# Patient Record
Sex: Female | Born: 1985 | Race: White | Hispanic: No | Marital: Married | Smoking: Never smoker
Health system: Southern US, Community
[De-identification: ages and names within clinical notes are randomized; demographics above are authoritative.]

## PROBLEM LIST (undated history)

## (undated) ENCOUNTER — Inpatient Hospital Stay (HOSPITAL_COMMUNITY): Payer: Self-pay

## (undated) DIAGNOSIS — K219 Gastro-esophageal reflux disease without esophagitis: Secondary | ICD-10-CM

## (undated) DIAGNOSIS — G90A Postural orthostatic tachycardia syndrome (POTS): Secondary | ICD-10-CM

## (undated) DIAGNOSIS — R112 Nausea with vomiting, unspecified: Secondary | ICD-10-CM

## (undated) DIAGNOSIS — T8859XA Other complications of anesthesia, initial encounter: Secondary | ICD-10-CM

## (undated) DIAGNOSIS — F419 Anxiety disorder, unspecified: Secondary | ICD-10-CM

## (undated) DIAGNOSIS — Z9889 Other specified postprocedural states: Secondary | ICD-10-CM

## (undated) DIAGNOSIS — F32A Depression, unspecified: Secondary | ICD-10-CM

## (undated) HISTORY — DX: Gastro-esophageal reflux disease without esophagitis: K21.9

## (undated) HISTORY — PX: CLAVICLE SURGERY: SHX598

## (undated) HISTORY — PX: HIP SURGERY: SHX245

---

## 2012-11-14 DIAGNOSIS — F32A Depression, unspecified: Secondary | ICD-10-CM | POA: Insufficient documentation

## 2017-07-13 DIAGNOSIS — Z8742 Personal history of other diseases of the female genital tract: Secondary | ICD-10-CM | POA: Insufficient documentation

## 2020-12-10 ENCOUNTER — Other Ambulatory Visit (HOSPITAL_COMMUNITY): Payer: Self-pay

## 2020-12-10 MED ORDER — DULOXETINE HCL 20 MG PO CPEP
40.0000 mg | ORAL_CAPSULE | Freq: Every morning | ORAL | 0 refills | Status: DC
  Filled 2020-12-10 – 2020-12-18 (×2): qty 60, 30d supply, fill #0

## 2020-12-10 MED ORDER — METOPROLOL TARTRATE 25 MG PO TABS
25.0000 mg | ORAL_TABLET | Freq: Three times a day (TID) | ORAL | 3 refills | Status: DC
  Filled 2020-12-10 – 2020-12-18 (×2): qty 90, 30d supply, fill #0
  Filled 2021-01-24: qty 90, 30d supply, fill #1

## 2020-12-10 MED ORDER — GABAPENTIN 100 MG PO CAPS
200.0000 mg | ORAL_CAPSULE | Freq: Every day | ORAL | 2 refills | Status: DC
  Filled 2020-12-10 – 2021-03-03 (×2): qty 60, 30d supply, fill #0
  Filled 2021-04-07: qty 60, 30d supply, fill #1
  Filled 2021-06-10: qty 60, 30d supply, fill #2

## 2020-12-11 ENCOUNTER — Other Ambulatory Visit (HOSPITAL_COMMUNITY): Payer: Self-pay

## 2020-12-11 MED ORDER — LORAZEPAM 1 MG PO TABS
1.0000 mg | ORAL_TABLET | Freq: Two times a day (BID) | ORAL | 3 refills | Status: DC
  Filled 2020-12-11 – 2020-12-18 (×2): qty 60, 30d supply, fill #0
  Filled 2021-01-28: qty 60, 30d supply, fill #1
  Filled 2021-03-03: qty 60, 30d supply, fill #2
  Filled 2021-04-07: qty 60, 30d supply, fill #3

## 2020-12-18 ENCOUNTER — Other Ambulatory Visit (HOSPITAL_COMMUNITY): Payer: Self-pay

## 2020-12-19 ENCOUNTER — Other Ambulatory Visit (HOSPITAL_COMMUNITY): Payer: Self-pay

## 2020-12-30 DIAGNOSIS — S42001D Fracture of unspecified part of right clavicle, subsequent encounter for fracture with routine healing: Secondary | ICD-10-CM | POA: Diagnosis not present

## 2020-12-30 DIAGNOSIS — S42011D Anterior displaced fracture of sternal end of right clavicle, subsequent encounter for fracture with routine healing: Secondary | ICD-10-CM | POA: Diagnosis not present

## 2020-12-30 DIAGNOSIS — S143XXD Injury of brachial plexus, subsequent encounter: Secondary | ICD-10-CM | POA: Diagnosis not present

## 2021-01-10 DIAGNOSIS — E038 Other specified hypothyroidism: Secondary | ICD-10-CM | POA: Diagnosis not present

## 2021-01-10 DIAGNOSIS — E538 Deficiency of other specified B group vitamins: Secondary | ICD-10-CM | POA: Diagnosis not present

## 2021-01-10 DIAGNOSIS — B2790 Infectious mononucleosis, unspecified without complication: Secondary | ICD-10-CM | POA: Diagnosis not present

## 2021-01-10 DIAGNOSIS — E559 Vitamin D deficiency, unspecified: Secondary | ICD-10-CM | POA: Diagnosis not present

## 2021-01-10 DIAGNOSIS — I498 Other specified cardiac arrhythmias: Secondary | ICD-10-CM | POA: Diagnosis not present

## 2021-01-10 DIAGNOSIS — R790 Abnormal level of blood mineral: Secondary | ICD-10-CM | POA: Diagnosis not present

## 2021-01-10 DIAGNOSIS — F419 Anxiety disorder, unspecified: Secondary | ICD-10-CM | POA: Diagnosis not present

## 2021-01-10 DIAGNOSIS — E60 Dietary zinc deficiency: Secondary | ICD-10-CM | POA: Diagnosis not present

## 2021-01-10 DIAGNOSIS — E612 Magnesium deficiency: Secondary | ICD-10-CM | POA: Diagnosis not present

## 2021-01-24 ENCOUNTER — Other Ambulatory Visit (HOSPITAL_COMMUNITY): Payer: Self-pay

## 2021-01-27 ENCOUNTER — Other Ambulatory Visit (HOSPITAL_COMMUNITY): Payer: Self-pay

## 2021-01-28 ENCOUNTER — Other Ambulatory Visit (HOSPITAL_COMMUNITY): Payer: Self-pay

## 2021-01-28 MED ORDER — DULOXETINE HCL 20 MG PO CPEP
40.0000 mg | ORAL_CAPSULE | Freq: Every morning | ORAL | 2 refills | Status: DC
  Filled 2021-01-28: qty 60, 30d supply, fill #0
  Filled 2021-03-03: qty 60, 30d supply, fill #1
  Filled 2021-04-07: qty 60, 30d supply, fill #2

## 2021-03-03 ENCOUNTER — Other Ambulatory Visit (HOSPITAL_COMMUNITY): Payer: Self-pay

## 2021-03-06 ENCOUNTER — Encounter (HOSPITAL_BASED_OUTPATIENT_CLINIC_OR_DEPARTMENT_OTHER): Payer: Self-pay | Admitting: *Deleted

## 2021-03-06 ENCOUNTER — Other Ambulatory Visit: Payer: Self-pay

## 2021-03-06 ENCOUNTER — Emergency Department (HOSPITAL_BASED_OUTPATIENT_CLINIC_OR_DEPARTMENT_OTHER)
Admission: EM | Admit: 2021-03-06 | Discharge: 2021-03-07 | Disposition: A | Discharge status: Home / Self Care | Payer: 59 | Attending: Emergency Medicine | Admitting: Emergency Medicine

## 2021-03-06 DIAGNOSIS — R197 Diarrhea, unspecified: Secondary | ICD-10-CM | POA: Insufficient documentation

## 2021-03-06 DIAGNOSIS — R000 Tachycardia, unspecified: Secondary | ICD-10-CM | POA: Diagnosis not present

## 2021-03-06 DIAGNOSIS — R112 Nausea with vomiting, unspecified: Secondary | ICD-10-CM | POA: Diagnosis not present

## 2021-03-06 DIAGNOSIS — R1084 Generalized abdominal pain: Secondary | ICD-10-CM | POA: Diagnosis not present

## 2021-03-06 DIAGNOSIS — Z79899 Other long term (current) drug therapy: Secondary | ICD-10-CM | POA: Diagnosis not present

## 2021-03-06 HISTORY — DX: Anxiety disorder, unspecified: F41.9

## 2021-03-06 HISTORY — DX: Depression, unspecified: F32.A

## 2021-03-06 HISTORY — DX: Postural orthostatic tachycardia syndrome (POTS): G90.A

## 2021-03-06 LAB — URINALYSIS, ROUTINE W REFLEX MICROSCOPIC: Specific Gravity, Urine: <1.005 — ABNORMAL LOW (ref 1.005–1.030)

## 2021-03-06 LAB — CBC
HCT: 38 % (ref 36.0–46.0)
Hemoglobin: 12.8 g/dL (ref 12.0–15.0)
MCH: 32.9 pg (ref 26.0–34.0)
MCHC: 33.7 g/dL (ref 30.0–36.0)
MCV: 97.7 fL (ref 80.0–100.0)
Platelets: 236 10*3/uL (ref 150–400)
RBC: 3.89 MIL/uL (ref 3.87–5.11)
RDW: 11.7 % (ref 11.5–15.5)
WBC: 9 10*3/uL (ref 4.0–10.5)
nRBC: 0 % (ref 0.0–0.2)

## 2021-03-06 LAB — COMPREHENSIVE METABOLIC PANEL
ALT: 10 U/L (ref 0–44)
AST: 14 U/L — ABNORMAL LOW (ref 15–41)
Albumin: 4 g/dL (ref 3.5–5.0)
Alkaline Phosphatase: 31 U/L — ABNORMAL LOW (ref 38–126)
Anion gap: 8 (ref 5–15)
BUN: 12 mg/dL (ref 6–20)
CO2: 25 mmol/L (ref 22–32)
Calcium: 8 mg/dL — ABNORMAL LOW (ref 8.9–10.3)
Chloride: 107 mmol/L (ref 98–111)
Creatinine, Ser: 0.62 mg/dL (ref 0.44–1.00)
GFR, Estimated: >60 mL/min (ref >60)
Glucose, Bld: 104 mg/dL — ABNORMAL HIGH (ref 70–99)
Potassium: 3.8 mmol/L (ref 3.5–5.1)
Sodium: 140 mmol/L (ref 135–145)
Total Bilirubin: 0.9 mg/dL (ref 0.3–1.2)
Total Protein: 6.5 g/dL (ref 6.5–8.1)

## 2021-03-06 LAB — LIPASE, BLOOD: Lipase: 14 U/L (ref 11–51)

## 2021-03-06 LAB — URINALYSIS, MICROSCOPIC (REFLEX): RBC / HPF: >50 RBC/hpf (ref 0–5)

## 2021-03-06 LAB — PREGNANCY, URINE: Preg Test, Ur: NEGATIVE

## 2021-03-06 MED ORDER — SODIUM CHLORIDE 0.9 % IV SOLN
25.0000 mg | Freq: Four times a day (QID) | INTRAVENOUS | Status: DC | PRN
  Administered 2021-03-06: 25 mg via INTRAVENOUS
  Filled 2021-03-06: qty 1

## 2021-03-06 MED ORDER — ONDANSETRON HCL 4 MG/2ML IJ SOLN
4.0000 mg | Freq: Once | INTRAMUSCULAR | Status: AC
  Administered 2021-03-06: 4 mg via INTRAVENOUS
  Filled 2021-03-06: qty 2

## 2021-03-06 MED ORDER — LACTATED RINGERS IV BOLUS
1000.0000 mL | Freq: Once | INTRAVENOUS | Status: AC
  Administered 2021-03-06: 1000 mL via INTRAVENOUS

## 2021-03-06 MED ORDER — PROMETHAZINE HCL 25 MG/ML IJ SOLN
INTRAMUSCULAR | Status: AC
  Filled 2021-03-06: qty 1

## 2021-03-06 MED ORDER — LACTATED RINGERS IV BOLUS
1000.0000 mL | Freq: Once | INTRAVENOUS | Status: AC
  Administered 2021-03-07: 1000 mL via INTRAVENOUS

## 2021-03-06 NOTE — ED Notes (Signed)
N/V/D since 3pm today.  Pt had 4 episodes of vomiting and about 6 episodes of diarrhea. No fever.  Pt does not feel it was food related.

## 2021-03-06 NOTE — ED Notes (Signed)
Pt had 1L NS and 4mg  zofran from ems at home but continued to be very nauseated

## 2021-03-06 NOTE — ED Notes (Signed)
EDP Charm Barges made aware of Persistent Nausea and Vomiting.
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
## 2021-03-07 ENCOUNTER — Other Ambulatory Visit (HOSPITAL_COMMUNITY): Payer: Self-pay

## 2021-03-07 DIAGNOSIS — R112 Nausea with vomiting, unspecified: Secondary | ICD-10-CM | POA: Diagnosis not present

## 2021-03-07 DIAGNOSIS — Z79899 Other long term (current) drug therapy: Secondary | ICD-10-CM | POA: Diagnosis not present

## 2021-03-07 DIAGNOSIS — R000 Tachycardia, unspecified: Secondary | ICD-10-CM | POA: Diagnosis not present

## 2021-03-07 DIAGNOSIS — R197 Diarrhea, unspecified: Secondary | ICD-10-CM | POA: Diagnosis not present

## 2021-03-07 LAB — C DIFFICILE QUICK SCREEN W PCR REFLEX
C Diff antigen: NEGATIVE
C Diff interpretation: NOT DETECTED
C Diff toxin: NEGATIVE

## 2021-03-07 LAB — GASTROINTESTINAL PANEL BY PCR, STOOL (REPLACES STOOL CULTURE)

## 2021-03-07 MED ORDER — ONDANSETRON 8 MG PO TBDP
8.0000 mg | ORAL_TABLET | Freq: Three times a day (TID) | ORAL | 0 refills | Status: DC | PRN
  Filled 2021-03-07: qty 4, 2d supply, fill #0

## 2021-03-07 MED ORDER — ONDANSETRON 8 MG PO TBDP: ORAL_TABLET | ORAL | 0 refills | Status: DC

## 2021-03-07 MED ORDER — LORAZEPAM 1 MG PO TABS
0.5000 mg | ORAL_TABLET | Freq: Once | ORAL | Status: AC
  Administered 2021-03-07: 0.5 mg via ORAL
  Filled 2021-03-07: qty 1

## 2021-03-07 NOTE — ED Notes (Addendum)
Lab called with Positive results for Noro Virus, Dr Rubin Payor aware. No new orders given. Attempted to reach patient, no answer.

## 2021-03-07 NOTE — ED Notes (Signed)
This RN presented the AVS utilizing Teachback Method. Patient verbalizes understanding of Discharge Instructions. Opportunity for Questioning and Answers were provided. Patient Discharged from ED ambulatory to Home.   

## 2021-03-17 ENCOUNTER — Other Ambulatory Visit (HOSPITAL_COMMUNITY): Payer: Self-pay

## 2021-04-03 DIAGNOSIS — J343 Hypertrophy of nasal turbinates: Secondary | ICD-10-CM | POA: Diagnosis not present

## 2021-04-03 DIAGNOSIS — J310 Chronic rhinitis: Secondary | ICD-10-CM | POA: Diagnosis not present

## 2021-04-03 DIAGNOSIS — J342 Deviated nasal septum: Secondary | ICD-10-CM | POA: Diagnosis not present

## 2021-04-04 ENCOUNTER — Other Ambulatory Visit: Payer: Self-pay | Admitting: Otolaryngology

## 2021-04-04 ENCOUNTER — Other Ambulatory Visit (HOSPITAL_COMMUNITY): Payer: Self-pay | Admitting: Otolaryngology

## 2021-04-04 DIAGNOSIS — J320 Chronic maxillary sinusitis: Secondary | ICD-10-CM

## 2021-04-07 ENCOUNTER — Other Ambulatory Visit (HOSPITAL_COMMUNITY): Payer: Self-pay

## 2021-04-14 ENCOUNTER — Other Ambulatory Visit: Payer: Self-pay

## 2021-04-14 ENCOUNTER — Ambulatory Visit (HOSPITAL_BASED_OUTPATIENT_CLINIC_OR_DEPARTMENT_OTHER)
Admission: RE | Admit: 2021-04-14 | Discharge: 2021-04-14 | Disposition: A | Discharge status: Home / Self Care | Payer: 59 | Source: Ambulatory Visit | Attending: Otolaryngology | Admitting: Otolaryngology

## 2021-04-14 DIAGNOSIS — J320 Chronic maxillary sinusitis: Secondary | ICD-10-CM | POA: Diagnosis not present

## 2021-04-15 DIAGNOSIS — R5382 Chronic fatigue, unspecified: Secondary | ICD-10-CM | POA: Diagnosis not present

## 2021-04-15 DIAGNOSIS — E349 Endocrine disorder, unspecified: Secondary | ICD-10-CM | POA: Diagnosis not present

## 2021-04-15 DIAGNOSIS — E569 Vitamin deficiency, unspecified: Secondary | ICD-10-CM | POA: Diagnosis not present

## 2021-04-15 DIAGNOSIS — G90A Postural orthostatic tachycardia syndrome (POTS): Secondary | ICD-10-CM | POA: Diagnosis not present

## 2021-04-15 DIAGNOSIS — Z8349 Family history of other endocrine, nutritional and metabolic diseases: Secondary | ICD-10-CM | POA: Diagnosis not present

## 2021-05-06 ENCOUNTER — Other Ambulatory Visit (HOSPITAL_COMMUNITY): Payer: Self-pay

## 2021-05-06 MED ORDER — DULOXETINE HCL 20 MG PO CPEP
40.0000 mg | ORAL_CAPSULE | Freq: Every morning | ORAL | 2 refills | Status: DC
  Filled 2021-05-06: qty 60, 30d supply, fill #0
  Filled 2021-06-10: qty 60, 30d supply, fill #1
  Filled 2021-07-04: qty 60, 30d supply, fill #2

## 2021-05-09 ENCOUNTER — Other Ambulatory Visit (HOSPITAL_COMMUNITY): Payer: Self-pay

## 2021-06-10 ENCOUNTER — Other Ambulatory Visit (HOSPITAL_COMMUNITY): Payer: Self-pay

## 2021-06-11 ENCOUNTER — Other Ambulatory Visit (HOSPITAL_COMMUNITY): Payer: Self-pay

## 2021-06-16 ENCOUNTER — Other Ambulatory Visit (HOSPITAL_COMMUNITY): Payer: Self-pay

## 2021-07-03 ENCOUNTER — Emergency Department (HOSPITAL_COMMUNITY)
Admission: EM | Admit: 2021-07-03 | Discharge: 2021-07-03 | Disposition: A | Discharge status: Home / Self Care | Payer: 59 | Attending: Emergency Medicine | Admitting: Emergency Medicine

## 2021-07-03 ENCOUNTER — Other Ambulatory Visit: Payer: Self-pay

## 2021-07-03 ENCOUNTER — Encounter (HOSPITAL_COMMUNITY): Payer: Self-pay | Admitting: Emergency Medicine

## 2021-07-03 ENCOUNTER — Other Ambulatory Visit (HOSPITAL_COMMUNITY): Payer: Self-pay

## 2021-07-03 DIAGNOSIS — N9489 Other specified conditions associated with female genital organs and menstrual cycle: Secondary | ICD-10-CM | POA: Insufficient documentation

## 2021-07-03 DIAGNOSIS — R197 Diarrhea, unspecified: Secondary | ICD-10-CM | POA: Diagnosis not present

## 2021-07-03 DIAGNOSIS — R112 Nausea with vomiting, unspecified: Secondary | ICD-10-CM | POA: Insufficient documentation

## 2021-07-03 DIAGNOSIS — R000 Tachycardia, unspecified: Secondary | ICD-10-CM | POA: Diagnosis not present

## 2021-07-03 DIAGNOSIS — J029 Acute pharyngitis, unspecified: Secondary | ICD-10-CM | POA: Diagnosis not present

## 2021-07-03 DIAGNOSIS — R0981 Nasal congestion: Secondary | ICD-10-CM | POA: Diagnosis not present

## 2021-07-03 DIAGNOSIS — H9209 Otalgia, unspecified ear: Secondary | ICD-10-CM | POA: Diagnosis not present

## 2021-07-03 DIAGNOSIS — R059 Cough, unspecified: Secondary | ICD-10-CM | POA: Diagnosis not present

## 2021-07-03 DIAGNOSIS — Z20822 Contact with and (suspected) exposure to covid-19: Secondary | ICD-10-CM | POA: Insufficient documentation

## 2021-07-03 LAB — COMPREHENSIVE METABOLIC PANEL
ALT: 30 U/L (ref 0–44)
AST: 22 U/L (ref 15–41)
Albumin: 4.5 g/dL (ref 3.5–5.0)
Alkaline Phosphatase: 44 U/L (ref 38–126)
Anion gap: 9 (ref 5–15)
BUN: 15 mg/dL (ref 6–20)
CO2: 23 mmol/L (ref 22–32)
Calcium: 8.8 mg/dL — ABNORMAL LOW (ref 8.9–10.3)
Chloride: 104 mmol/L (ref 98–111)
Creatinine, Ser: 0.61 mg/dL (ref 0.44–1.00)
GFR, Estimated: >60 mL/min (ref >60)
Glucose, Bld: 150 mg/dL — ABNORMAL HIGH (ref 70–99)
Potassium: 3.3 mmol/L — ABNORMAL LOW (ref 3.5–5.1)
Sodium: 136 mmol/L (ref 135–145)
Total Bilirubin: 1.1 mg/dL (ref 0.3–1.2)
Total Protein: 7.7 g/dL (ref 6.5–8.1)

## 2021-07-03 LAB — CBC WITH DIFFERENTIAL/PLATELET
Abs Immature Granulocytes: 0.03 10*3/uL (ref 0.00–0.07)
Basophils Absolute: 0 10*3/uL (ref 0.0–0.1)
Basophils Relative: 0 %
Eosinophils Absolute: 0 10*3/uL (ref 0.0–0.5)
Eosinophils Relative: 1 %
HCT: 41.9 % (ref 36.0–46.0)
Hemoglobin: 14.3 g/dL (ref 12.0–15.0)
Immature Granulocytes: 0 %
Lymphocytes Relative: 6 %
Lymphs Abs: 0.5 10*3/uL — ABNORMAL LOW (ref 0.7–4.0)
MCH: 32.1 pg (ref 26.0–34.0)
MCHC: 34.1 g/dL (ref 30.0–36.0)
MCV: 93.9 fL (ref 80.0–100.0)
Monocytes Absolute: 0.4 10*3/uL (ref 0.1–1.0)
Monocytes Relative: 4 %
Neutro Abs: 7.1 10*3/uL (ref 1.7–7.7)
Neutrophils Relative %: 89 %
Platelets: 197 10*3/uL (ref 150–400)
RBC: 4.46 MIL/uL (ref 3.87–5.11)
RDW: 12.9 % (ref 11.5–15.5)
WBC: 8 10*3/uL (ref 4.0–10.5)
nRBC: 0 % (ref 0.0–0.2)

## 2021-07-03 LAB — RESP PANEL BY RT-PCR (FLU A&B, COVID) ARPGX2
Influenza A by PCR: NEGATIVE
Influenza B by PCR: NEGATIVE
SARS Coronavirus 2 by RT PCR: NEGATIVE

## 2021-07-03 LAB — I-STAT BETA HCG BLOOD, ED (MC, WL, AP ONLY): I-stat hCG, quantitative: <5 m[IU]/mL (ref <5)

## 2021-07-03 MED ORDER — SODIUM CHLORIDE 0.9 % IV BOLUS
1000.0000 mL | Freq: Once | INTRAVENOUS | Status: AC
  Administered 2021-07-03: 1000 mL via INTRAVENOUS

## 2021-07-03 MED ORDER — DIPHENOXYLATE-ATROPINE 2.5-0.025 MG PO TABS
1.0000 | ORAL_TABLET | Freq: Four times a day (QID) | ORAL | 0 refills | Status: DC | PRN
  Filled 2021-07-03 (×2): qty 30, 4d supply, fill #0

## 2021-07-03 MED ORDER — ONDANSETRON HCL 4 MG/2ML IJ SOLN
4.0000 mg | Freq: Once | INTRAMUSCULAR | Status: AC
  Administered 2021-07-03: 4 mg via INTRAVENOUS
  Filled 2021-07-03: qty 2

## 2021-07-03 MED ORDER — DIPHENHYDRAMINE HCL 50 MG/ML IJ SOLN
50.0000 mg | Freq: Once | INTRAMUSCULAR | Status: DC
  Filled 2021-07-03: qty 1

## 2021-07-03 MED ORDER — PROMETHAZINE HCL 25 MG PO TABS
25.0000 mg | ORAL_TABLET | Freq: Four times a day (QID) | ORAL | 0 refills | Status: DC | PRN
  Filled 2021-07-03: qty 15, 4d supply, fill #0

## 2021-07-03 MED ORDER — ONDANSETRON HCL 4 MG PO TABS
4.0000 mg | ORAL_TABLET | Freq: Four times a day (QID) | ORAL | 0 refills | Status: DC
  Filled 2021-07-03: qty 12, 3d supply, fill #0

## 2021-07-03 MED ORDER — KETOROLAC TROMETHAMINE 30 MG/ML IJ SOLN
15.0000 mg | Freq: Once | INTRAMUSCULAR | Status: AC
  Administered 2021-07-03: 15 mg via INTRAVENOUS
  Filled 2021-07-03: qty 1

## 2021-07-03 MED ORDER — SODIUM CHLORIDE 0.9 % IV SOLN
25.0000 mg | Freq: Four times a day (QID) | INTRAVENOUS | Status: DC | PRN
  Administered 2021-07-03: 25 mg via INTRAVENOUS
  Filled 2021-07-03: qty 1

## 2021-07-03 MED ORDER — HALOPERIDOL LACTATE 5 MG/ML IJ SOLN
2.0000 mg | Freq: Once | INTRAMUSCULAR | Status: AC
  Administered 2021-07-03: 2 mg via INTRAVENOUS
  Filled 2021-07-03: qty 1

## 2021-07-03 MED ORDER — DIPHENHYDRAMINE HCL 50 MG/ML IJ SOLN
25.0000 mg | Freq: Once | INTRAMUSCULAR | Status: AC
  Administered 2021-07-03: 25 mg via INTRAVENOUS

## 2021-07-03 MED ORDER — PROMETHAZINE HCL 25 MG/ML IJ SOLN
INTRAMUSCULAR | Status: AC
  Filled 2021-07-03: qty 1

## 2021-07-03 NOTE — ED Triage Notes (Signed)
Pt c/o n/v/d that started this am.  ?

## 2021-07-03 NOTE — ED Provider Notes (Signed)
?[facility] EMERGENCY DEPARTMENT ?Provider Note ? ? ?CSN: [id number] ?Arrival date & time: 07/03/21  8588 ? ?  ? ?History ? ?Chief Complaint  ?Patient presents with  ? Emesis  ? ? ?Lauren Holmes is a 36 y.o. female. ? ?Patient presents to the emergency department for evaluation of nausea, vomiting and diarrhea.  Patient reports that symptoms just began several hours ago.  She has been unable to hold anything down.  She does report that she has been sick for about a week with nasal congestion, cough, ear pain, sore throat. ? ? ?  ? ?Home Medications ?Prior to Admission medications   ?Medication Sig Start Date End Date Taking? Authorizing Provider  ?DULoxetine (CYMBALTA) 20 MG capsule Take 2 capsules (40 mg total) by mouth in the morning. 05/06/21     ?gabapentin (NEURONTIN) 100 MG capsule Take 2 capsules (200 mg total) by mouth daily. 11/18/20     ?LORazepam (ATIVAN) 1 MG tablet Take 1 tablet (1 mg total) by mouth 2 (two) times daily. 12/11/20     ?metoprolol tartrate (LOPRESSOR) 25 MG tablet Take 1 tablet (25 mg total) by mouth 3 (three) times daily. 10/17/20     ?ondansetron (ZOFRAN ODT) 8 MG disintegrating tablet Take 1 tablet (8 mg total) by mouth every 8 (eight) hours as needed for nausea 03/07/21   Palumbo, April, MD  ?   ? ?Allergies    ?Oxycodone and Hydroxyzine   ? ?Review of Systems   ?Review of Systems  ?HENT:  Positive for congestion, ear pain and sore throat.   ?Respiratory:  Positive for cough.   ?Gastrointestinal:  Positive for diarrhea, nausea and vomiting.  ? ?Physical Exam ?Updated Vital Signs ?BP (!) 109/50   Pulse (!) 121   Temp 97.8 ?F (36.6 ?C) (Oral)   Resp 18   Ht 5' (1.524 m)   Wt 56.7 kg   SpO2 100%   BMI 24.41 kg/m?  ?Physical Exam ?Vitals and nursing note reviewed.  ?Constitutional:   ?   General: She is not in acute distress. ?   Appearance: She is well-developed.  ?HENT:  ?   Head: Normocephalic and atraumatic.  ?   Right Ear: Tympanic membrane normal.  ?   Left Ear: Tympanic membrane  normal.  ?   Mouth/Throat:  ?   Mouth: Mucous membranes are dry.  ?Eyes:  ?   General: Vision grossly intact. Gaze aligned appropriately.  ?   Extraocular Movements: Extraocular movements intact.  ?   Conjunctiva/sclera: Conjunctivae normal.  ?Cardiovascular:  ?   Rate and Rhythm: Regular rhythm. Tachycardia present.  ?   Pulses: Normal pulses.  ?   Heart sounds: Normal heart sounds, S1 normal and S2 normal. No murmur heard. ?  No friction rub. No gallop.  ?Pulmonary:  ?   Effort: Pulmonary effort is normal. No respiratory distress.  ?   Breath sounds: Normal breath sounds.  ?Abdominal:  ?   General: Bowel sounds are normal.  ?   Palpations: Abdomen is soft.  ?   Tenderness: There is no abdominal tenderness. There is no guarding or rebound.  ?   Hernia: No hernia is present.  ?Musculoskeletal:     ?   General: No swelling.  ?   Cervical back: Full passive range of motion without pain, normal range of motion and neck supple. No spinous process tenderness or muscular tenderness. Normal range of motion.  ?   Right lower leg: No edema.  ?   Left  lower leg: No edema.  ?Skin: ?   General: Skin is warm and dry.  ?   Capillary Refill: Capillary refill takes less than 2 seconds.  ?   Findings: No ecchymosis, erythema, rash or wound.  ?Neurological:  ?   General: No focal deficit present.  ?   Mental Status: She is alert and oriented to person, place, and time.  ?   GCS: GCS eye subscore is 4. GCS verbal subscore is 5. GCS motor subscore is 6.  ?   Cranial Nerves: Cranial nerves 2-12 are intact.  ?   Sensory: Sensation is intact.  ?   Motor: Motor function is intact.  ?   Coordination: Coordination is intact.  ?Psychiatric:     ?   Attention and Perception: Attention normal.     ?   Mood and Affect: Mood normal.     ?   Speech: Speech normal.     ?   Behavior: Behavior normal.  ? ? ?ED Results / Procedures / Treatments   ?Labs ?(all labs ordered are listed, but only abnormal results are displayed) ?Labs Reviewed  ?CBC WITH  DIFFERENTIAL/PLATELET - Abnormal; Notable for the following components:  ?    Result Value  ? Lymphs Abs 0.5 (*)   ? All other components within normal limits  ?COMPREHENSIVE METABOLIC PANEL - Abnormal; Notable for the following components:  ? Potassium 3.3 (*)   ? Glucose, Bld 150 (*)   ? Calcium 8.8 (*)   ? All other components within normal limits  ?RESP PANEL BY RT-PCR (FLU A&B, COVID) ARPGX2  ?URINALYSIS, ROUTINE W REFLEX MICROSCOPIC  ?I-STAT BETA HCG BLOOD, ED ([facility], [facility], [facility] ONLY)  ? ? ?EKG ?None ? ?Radiology ?No results found. ? ?Procedures ?Procedures  ? ? ?Medications Ordered in ED ?Medications  ?promethazine (PHENERGAN) 25 mg in sodium chloride 0.9 % 50 mL IVPB (0 mg Intravenous Stopped 07/03/21 0616)  ?sodium chloride 0.9 % bolus 1,000 mL (has no administration in time range)  ?haloperidol lactate (HALDOL) injection 2 mg (has no administration in time range)  ?diphenhydrAMINE (BENADRYL) injection 50 mg (has no administration in time range)  ?ketorolac (TORADOL) 30 MG/ML injection 15 mg (has no administration in time range)  ?sodium chloride 0.9 % bolus 1,000 mL (0 mLs Intravenous Stopped 07/03/21 0615)  ? ? ?ED Course/ Medical Decision Making/ A&P ?  ?                        ?Medical Decision Making ?Amount and/or Complexity of Data Reviewed ?Labs: ordered. ? ?Risk ?Prescription drug management. ? ? ?Presents to the emergency department for cute onset of nausea, vomiting and diarrhea overnight.  She has had URI symptoms for approximately a week. ? ?Differential diagnosis: COVID, influenza, nonspecific viral illness. ? ?Patient has a benign abdominal exam.  No focal tenderness to suggest acute surgical process.  She is not pregnant.  Patient has a constellation of symptoms that suggest viral etiology.  She does appear mildly dehydrated.  Treated with IV fluids and antiemetics.  Lab work is reassuring. ? ? ? ? ? ? ? ?Final Clinical Impression(s) / ED Diagnoses ?Final diagnoses:  ?Nausea vomiting and diarrhea   ? ? ?Rx / DC Orders ?ED Discharge Orders   ? ? None  ? ?  ? ? ?  ?Gilda Crease, MD ?07/03/21 6237 ? ?

## 2021-07-03 NOTE — ED Provider Notes (Signed)
Change of shift care was accepted from off going provider, patient has reassuring lab work-up, she has been given 2 L of IV fluid and multiple ENT nausea medicines, she states that she is not as nauseated, she has still has the feeling of cramping, she states that she wants to go home and does not want to try any other medicines here.  I think this is reasonable, she seems to have her medical decision capacity, she has a nonsurgical abdomen, she understands indications for return.  Prescriptions for nausea have been given ?  ?Noemi Chapel, MD ?07/03/21 0740 ? ?

## 2021-07-03 NOTE — Discharge Instructions (Addendum)
Both promethazine and Zofran have been prescribed, please drink plenty of clear liquids and return to the emergency department as needed for severe or worsening symptoms.  I would encourage you to stay out of work for the next few days as this may be contagious to others ?

## 2021-07-04 ENCOUNTER — Other Ambulatory Visit (HOSPITAL_COMMUNITY): Payer: Self-pay

## 2021-07-08 DIAGNOSIS — U099 Post covid-19 condition, unspecified: Secondary | ICD-10-CM | POA: Diagnosis not present

## 2021-07-08 DIAGNOSIS — F419 Anxiety disorder, unspecified: Secondary | ICD-10-CM | POA: Diagnosis not present

## 2021-07-08 DIAGNOSIS — G479 Sleep disorder, unspecified: Secondary | ICD-10-CM | POA: Diagnosis not present

## 2021-07-08 DIAGNOSIS — G90A Postural orthostatic tachycardia syndrome (POTS): Secondary | ICD-10-CM | POA: Diagnosis not present

## 2021-07-08 DIAGNOSIS — E569 Vitamin deficiency, unspecified: Secondary | ICD-10-CM | POA: Diagnosis not present

## 2021-07-08 DIAGNOSIS — R5382 Chronic fatigue, unspecified: Secondary | ICD-10-CM | POA: Diagnosis not present

## 2021-07-09 ENCOUNTER — Other Ambulatory Visit (HOSPITAL_COMMUNITY): Payer: Self-pay

## 2021-07-18 ENCOUNTER — Other Ambulatory Visit (HOSPITAL_COMMUNITY): Payer: Self-pay

## 2021-07-18 MED ORDER — PROGESTERONE MICRONIZED 100 MG PO CAPS
100.0000 mg | ORAL_CAPSULE | Freq: Every evening | ORAL | 4 refills | Status: DC
  Filled 2021-07-18: qty 90, 90d supply, fill #0

## 2021-07-18 MED ORDER — DULOXETINE HCL 20 MG PO CPEP
40.0000 mg | ORAL_CAPSULE | Freq: Every evening | ORAL | 5 refills | Status: DC
  Filled 2021-07-18 – 2021-08-06 (×2): qty 180, 90d supply, fill #0
  Filled 2021-10-26 – 2021-10-27 (×3): qty 180, 90d supply, fill #1
  Filled 2022-01-28: qty 180, 90d supply, fill #2

## 2021-08-05 DIAGNOSIS — J321 Chronic frontal sinusitis: Secondary | ICD-10-CM | POA: Diagnosis not present

## 2021-08-05 DIAGNOSIS — J342 Deviated nasal septum: Secondary | ICD-10-CM | POA: Diagnosis not present

## 2021-08-05 DIAGNOSIS — J322 Chronic ethmoidal sinusitis: Secondary | ICD-10-CM | POA: Diagnosis not present

## 2021-08-06 ENCOUNTER — Other Ambulatory Visit (HOSPITAL_COMMUNITY): Payer: Self-pay

## 2021-08-13 ENCOUNTER — Other Ambulatory Visit: Payer: Self-pay | Admitting: Otolaryngology

## 2021-09-12 ENCOUNTER — Other Ambulatory Visit (HOSPITAL_COMMUNITY): Payer: Self-pay

## 2021-09-12 MED ORDER — CLONIDINE HCL 0.2 MG PO TABS
ORAL_TABLET | ORAL | 4 refills | Status: DC
  Filled 2021-09-12: qty 45, 30d supply, fill #0

## 2021-09-22 ENCOUNTER — Other Ambulatory Visit (HOSPITAL_COMMUNITY): Payer: Self-pay

## 2021-10-02 ENCOUNTER — Encounter (HOSPITAL_BASED_OUTPATIENT_CLINIC_OR_DEPARTMENT_OTHER): Payer: Self-pay | Admitting: Otolaryngology

## 2021-10-02 ENCOUNTER — Other Ambulatory Visit: Payer: Self-pay

## 2021-10-12 NOTE — Anesthesia Preprocedure Evaluation (Addendum)
Anesthesia Evaluation  Patient identified by MRN, date of birth, ID band Patient awake    Reviewed: Allergy & Precautions, NPO status , Patient's Chart, lab work & pertinent test results, reviewed documented beta blocker date and time   History of Anesthesia Complications (+) PONV and history of anesthetic complications  Airway Mallampati: II  TM Distance: >3 FB Neck ROM: Full    Dental no notable dental hx.    Pulmonary neg pulmonary ROS,    Pulmonary exam normal        Cardiovascular Pt. on home beta blockers Normal cardiovascular exam  POTS   Neuro/Psych Anxiety Depression    GI/Hepatic negative GI ROS, Neg liver ROS,   Endo/Other  negative endocrine ROS  Renal/GU negative Renal ROS  negative genitourinary   Musculoskeletal negative musculoskeletal ROS (+)   Abdominal   Peds  Hematology negative hematology ROS (+)   Anesthesia Other Findings Day of surgery medications reviewed with patient.  Reproductive/Obstetrics negative OB ROS                            Anesthesia Physical Anesthesia Plan  ASA: 2  Anesthesia Plan: General   Post-op Pain Management: Tylenol PO (pre-op)* and Toradol IV (intra-op)*   Induction:   PONV Risk Score and Plan: 4 or greater and Midazolam, Scopolamine patch - Pre-op, Treatment may vary due to age or medical condition, Dexamethasone, Ondansetron, Propofol infusion and TIVA  Airway Management Planned: Oral ETT  Additional Equipment: None  Intra-op Plan:   Post-operative Plan: Extubation in OR  Informed Consent: I have reviewed the patients History and Physical, chart, labs and discussed the procedure including the risks, benefits and alternatives for the proposed anesthesia with the patient or authorized representative who has indicated his/her understanding and acceptance.     Dental advisory given  Plan Discussed with: CRNA  Anesthesia  Plan Comments:        Anesthesia Quick Evaluation

## 2021-10-13 ENCOUNTER — Ambulatory Visit (HOSPITAL_BASED_OUTPATIENT_CLINIC_OR_DEPARTMENT_OTHER): Payer: 59 | Admitting: Anesthesiology

## 2021-10-13 ENCOUNTER — Encounter (HOSPITAL_BASED_OUTPATIENT_CLINIC_OR_DEPARTMENT_OTHER): Payer: Self-pay | Admitting: Otolaryngology

## 2021-10-13 ENCOUNTER — Other Ambulatory Visit (HOSPITAL_COMMUNITY): Payer: Self-pay

## 2021-10-13 ENCOUNTER — Encounter (HOSPITAL_BASED_OUTPATIENT_CLINIC_OR_DEPARTMENT_OTHER)
Admission: RE | Disposition: A | Discharge status: Home / Self Care | Payer: Self-pay | Source: Ambulatory Visit | Attending: Otolaryngology

## 2021-10-13 ENCOUNTER — Other Ambulatory Visit: Payer: Self-pay

## 2021-10-13 ENCOUNTER — Ambulatory Visit (HOSPITAL_BASED_OUTPATIENT_CLINIC_OR_DEPARTMENT_OTHER)
Admission: RE | Admit: 2021-10-13 | Discharge: 2021-10-13 | Disposition: A | Discharge status: Home / Self Care | Payer: 59 | Source: Ambulatory Visit | Attending: Otolaryngology | Admitting: Otolaryngology

## 2021-10-13 DIAGNOSIS — J321 Chronic frontal sinusitis: Secondary | ICD-10-CM | POA: Diagnosis not present

## 2021-10-13 DIAGNOSIS — J342 Deviated nasal septum: Secondary | ICD-10-CM | POA: Insufficient documentation

## 2021-10-13 DIAGNOSIS — J338 Other polyp of sinus: Secondary | ICD-10-CM | POA: Diagnosis not present

## 2021-10-13 DIAGNOSIS — J328 Other chronic sinusitis: Secondary | ICD-10-CM | POA: Diagnosis not present

## 2021-10-13 DIAGNOSIS — J322 Chronic ethmoidal sinusitis: Secondary | ICD-10-CM

## 2021-10-13 DIAGNOSIS — J341 Cyst and mucocele of nose and nasal sinus: Secondary | ICD-10-CM | POA: Diagnosis not present

## 2021-10-13 DIAGNOSIS — Z01818 Encounter for other preprocedural examination: Secondary | ICD-10-CM

## 2021-10-13 DIAGNOSIS — J3489 Other specified disorders of nose and nasal sinuses: Secondary | ICD-10-CM

## 2021-10-13 DIAGNOSIS — J329 Chronic sinusitis, unspecified: Secondary | ICD-10-CM | POA: Diagnosis not present

## 2021-10-13 HISTORY — PX: SINUS ENDO WITH FUSION: SHX5329

## 2021-10-13 HISTORY — DX: Other specified postprocedural states: Z98.890

## 2021-10-13 HISTORY — PX: FRONTAL SINUS EXPLORATION: SHX6591

## 2021-10-13 HISTORY — PX: SEPTOPLASTY: SHX2393

## 2021-10-13 HISTORY — DX: Other specified postprocedural states: R11.2

## 2021-10-13 HISTORY — PX: ETHMOIDECTOMY: SHX5197

## 2021-10-13 HISTORY — DX: Other complications of anesthesia, initial encounter: T88.59XA

## 2021-10-13 LAB — POCT PREGNANCY, URINE: Preg Test, Ur: NEGATIVE

## 2021-10-13 SURGERY — SURGERY, PARANASAL SINUS, ENDOSCOPIC, WITH NASAL SEPTOPLASTY, TURBINOPLASTY, AND MAXILLARY SINUSOTOMY
Anesthesia: General | Site: Nose | Laterality: Left

## 2021-10-13 MED ORDER — ROCURONIUM BROMIDE 10 MG/ML (PF) SYRINGE
PREFILLED_SYRINGE | INTRAVENOUS | Status: AC
  Filled 2021-10-13: qty 10

## 2021-10-13 MED ORDER — FENTANYL CITRATE (PF) 100 MCG/2ML IJ SOLN
INTRAMUSCULAR | Status: DC | PRN
Start: 2021-10-13 — End: 2021-10-13
  Administered 2021-10-13 (×2): 50 ug via INTRAVENOUS
  Administered 2021-10-13: 100 ug via INTRAVENOUS

## 2021-10-13 MED ORDER — ACETAMINOPHEN 500 MG PO TABS
ORAL_TABLET | ORAL | Status: AC
  Filled 2021-10-13: qty 2

## 2021-10-13 MED ORDER — LIDOCAINE 2% (20 MG/ML) 5 ML SYRINGE
INTRAMUSCULAR | Status: AC
  Filled 2021-10-13: qty 5

## 2021-10-13 MED ORDER — FENTANYL CITRATE (PF) 100 MCG/2ML IJ SOLN
INTRAMUSCULAR | Status: AC
  Filled 2021-10-13: qty 2

## 2021-10-13 MED ORDER — OXYMETAZOLINE HCL 0.05 % NA SOLN
NASAL | Status: DC | PRN
  Administered 2021-10-13: 1 via TOPICAL

## 2021-10-13 MED ORDER — ONDANSETRON HCL 4 MG/2ML IJ SOLN
INTRAMUSCULAR | Status: AC
  Filled 2021-10-13: qty 2

## 2021-10-13 MED ORDER — CEFAZOLIN SODIUM-DEXTROSE 2-3 GM-%(50ML) IV SOLR
INTRAVENOUS | Status: DC | PRN
  Administered 2021-10-13: 2 g via INTRAVENOUS

## 2021-10-13 MED ORDER — TRAMADOL HCL 50 MG PO TABS
50.0000 mg | ORAL_TABLET | Freq: Once | ORAL | Status: AC
  Administered 2021-10-13: 50 mg via ORAL

## 2021-10-13 MED ORDER — LIDOCAINE HCL (CARDIAC) PF 100 MG/5ML IV SOSY
PREFILLED_SYRINGE | INTRAVENOUS | Status: DC | PRN
  Administered 2021-10-13: 60 mg via INTRAVENOUS

## 2021-10-13 MED ORDER — PROPOFOL 500 MG/50ML IV EMUL
INTRAVENOUS | Status: AC
  Filled 2021-10-13: qty 150

## 2021-10-13 MED ORDER — KETOROLAC TROMETHAMINE 30 MG/ML IJ SOLN
30.0000 mg | Freq: Once | INTRAMUSCULAR | Status: AC
  Administered 2021-10-13: 30 mg via INTRAVENOUS

## 2021-10-13 MED ORDER — DEXAMETHASONE SODIUM PHOSPHATE 10 MG/ML IJ SOLN
INTRAMUSCULAR | Status: AC
  Filled 2021-10-13: qty 1

## 2021-10-13 MED ORDER — CEFAZOLIN SODIUM 1 G IJ SOLR
INTRAMUSCULAR | Status: AC
  Filled 2021-10-13: qty 20

## 2021-10-13 MED ORDER — MIDAZOLAM HCL 5 MG/5ML IJ SOLN
INTRAMUSCULAR | Status: DC | PRN
  Administered 2021-10-13: 2 mg via INTRAVENOUS

## 2021-10-13 MED ORDER — TRAMADOL HCL 50 MG PO TABS
ORAL_TABLET | ORAL | Status: AC
  Filled 2021-10-13: qty 1

## 2021-10-13 MED ORDER — AMISULPRIDE (ANTIEMETIC) 5 MG/2ML IV SOLN: 10.0000 mg | Freq: Once | INTRAVENOUS | Status: DC | PRN

## 2021-10-13 MED ORDER — SUGAMMADEX SODIUM 200 MG/2ML IV SOLN
INTRAVENOUS | Status: DC | PRN
  Administered 2021-10-13: 115 mg via INTRAVENOUS

## 2021-10-13 MED ORDER — ROCURONIUM BROMIDE 100 MG/10ML IV SOLN
INTRAVENOUS | Status: DC | PRN
  Administered 2021-10-13: 40 mg via INTRAVENOUS

## 2021-10-13 MED ORDER — KETOROLAC TROMETHAMINE 30 MG/ML IJ SOLN
INTRAMUSCULAR | Status: AC
  Filled 2021-10-13: qty 1

## 2021-10-13 MED ORDER — PROPOFOL 10 MG/ML IV BOLUS
INTRAVENOUS | Status: DC | PRN
  Administered 2021-10-13: 120 mg via INTRAVENOUS

## 2021-10-13 MED ORDER — PROPOFOL 500 MG/50ML IV EMUL
INTRAVENOUS | Status: DC | PRN
  Administered 2021-10-13: 175 ug/kg/min via INTRAVENOUS
  Administered 2021-10-13: 200 ug/kg/min via INTRAVENOUS
  Administered 2021-10-13: 150 ug/kg/min via INTRAVENOUS

## 2021-10-13 MED ORDER — ONDANSETRON HCL 4 MG/2ML IJ SOLN
INTRAMUSCULAR | Status: DC | PRN
  Administered 2021-10-13: 4 mg via INTRAVENOUS

## 2021-10-13 MED ORDER — PROPOFOL 10 MG/ML IV BOLUS
INTRAVENOUS | Status: AC
  Filled 2021-10-13: qty 20

## 2021-10-13 MED ORDER — MIDAZOLAM HCL 2 MG/2ML IJ SOLN
INTRAMUSCULAR | Status: AC
  Filled 2021-10-13: qty 2

## 2021-10-13 MED ORDER — ACETAMINOPHEN 500 MG PO TABS
1000.0000 mg | ORAL_TABLET | Freq: Once | ORAL | Status: AC
Start: 2021-10-13 — End: 2021-10-13
  Administered 2021-10-13: 1000 mg via ORAL

## 2021-10-13 MED ORDER — HYDROCODONE-ACETAMINOPHEN 5-325 MG PO TABS
1.0000 | ORAL_TABLET | ORAL | 0 refills | Status: AC | PRN
  Filled 2021-10-13: qty 12, 2d supply, fill #0

## 2021-10-13 MED ORDER — SCOPOLAMINE 1 MG/3DAYS TD PT72
1.0000 | MEDICATED_PATCH | Freq: Once | TRANSDERMAL | Status: DC
  Administered 2021-10-13: 1.5 mg via TRANSDERMAL

## 2021-10-13 MED ORDER — AMOXICILLIN 875 MG PO TABS
875.0000 mg | ORAL_TABLET | Freq: Two times a day (BID) | ORAL | 0 refills | Status: AC
  Filled 2021-10-13: qty 6, 3d supply, fill #0

## 2021-10-13 MED ORDER — PROPOFOL 500 MG/50ML IV EMUL
INTRAVENOUS | Status: AC
  Filled 2021-10-13: qty 200

## 2021-10-13 MED ORDER — FENTANYL CITRATE (PF) 100 MCG/2ML IJ SOLN
25.0000 ug | INTRAMUSCULAR | Status: DC | PRN
  Administered 2021-10-13: 50 ug via INTRAVENOUS

## 2021-10-13 MED ORDER — SCOPOLAMINE 1 MG/3DAYS TD PT72
MEDICATED_PATCH | TRANSDERMAL | Status: AC
  Filled 2021-10-13: qty 1

## 2021-10-13 MED ORDER — LIDOCAINE-EPINEPHRINE 1 %-1:100000 IJ SOLN
INTRAMUSCULAR | Status: DC | PRN
  Administered 2021-10-13: 3 mL

## 2021-10-13 MED ORDER — DEXAMETHASONE SODIUM PHOSPHATE 4 MG/ML IJ SOLN
INTRAMUSCULAR | Status: DC | PRN
  Administered 2021-10-13: 8 mg via INTRAVENOUS

## 2021-10-13 MED ORDER — MUPIROCIN 2 % EX OINT
TOPICAL_OINTMENT | CUTANEOUS | Status: DC | PRN
  Administered 2021-10-13: 1 via TOPICAL

## 2021-10-13 MED ORDER — LACTATED RINGERS IV SOLN: INTRAVENOUS | Status: DC

## 2021-10-13 SURGICAL SUPPLY — 55 items
ATTRACTOMAT 16X20 MAGNETIC DRP (DRAPES) IMPLANT
BLADE RAD40 ROTATE 4M 4 5PK (BLADE) IMPLANT
BLADE RAD60 ROTATE M4 4 5PK (BLADE) IMPLANT
BLADE ROTATE RAD 12 4 M4 (BLADE) IMPLANT
BLADE ROTATE RAD 40 4 M4 (BLADE) ×1 IMPLANT
BLADE ROTATE TRICUT 4X13 M4 (BLADE) ×3 IMPLANT
BLADE SURG 15 STRL LF DISP TIS (BLADE) IMPLANT
BLADE SURG 15 STRL SS (BLADE)
BLADE TRICUT ROTATE M4 4 5PK (BLADE) IMPLANT
BUR HS RAD FRONTAL 3 (BURR) IMPLANT
CANISTER SUC SOCK COL 7IN (MISCELLANEOUS) ×3 IMPLANT
CANISTER SUCT 1200ML W/VALVE (MISCELLANEOUS) ×6 IMPLANT
COAGULATOR SUCT 8FR VV (MISCELLANEOUS) ×3 IMPLANT
DEFOGGER MIRROR 1QT (MISCELLANEOUS) ×3 IMPLANT
DRSG NASAL KENNEDY LMNT 8CM (GAUZE/BANDAGES/DRESSINGS) IMPLANT
DRSG NASOPORE 8CM (GAUZE/BANDAGES/DRESSINGS) IMPLANT
DRSG TELFA 3X8 NADH (GAUZE/BANDAGES/DRESSINGS) IMPLANT
ELECT REM PT RETURN 9FT ADLT (ELECTROSURGICAL) ×3
ELECTRODE REM PT RTRN 9FT ADLT (ELECTROSURGICAL) ×2 IMPLANT
GLOVE BIO SURGEON STRL SZ7.5 (GLOVE) ×3 IMPLANT
GLOVE BIOGEL PI IND STRL 7.0 (GLOVE) IMPLANT
GLOVE BIOGEL PI INDICATOR 7.0 (GLOVE) ×1
GOWN STRL REUS W/ TWL LRG LVL3 (GOWN DISPOSABLE) ×4 IMPLANT
GOWN STRL REUS W/TWL LRG LVL3 (GOWN DISPOSABLE) ×6
HEMOSTAT SURGICEL 2X14 (HEMOSTASIS) IMPLANT
IV NS 500ML (IV SOLUTION) ×3
IV NS 500ML BAXH (IV SOLUTION) ×2 IMPLANT
NDL HYPO 25X1 1.5 SAFETY (NEEDLE) ×2 IMPLANT
NDL SPNL 25GX3.5 QUINCKE BL (NEEDLE) IMPLANT
NEEDLE HYPO 25X1 1.5 SAFETY (NEEDLE) ×3 IMPLANT
NEEDLE SPNL 25GX3.5 QUINCKE BL (NEEDLE) IMPLANT
NS IRRIG 1000ML POUR BTL (IV SOLUTION) ×3 IMPLANT
PACK BASIN DAY SURGERY FS (CUSTOM PROCEDURE TRAY) ×3 IMPLANT
PACK ENT DAY SURGERY (CUSTOM PROCEDURE TRAY) ×3 IMPLANT
PAD DRESSING TELFA 3X8 NADH (GAUZE/BANDAGES/DRESSINGS) IMPLANT
SLEEVE SCD COMPRESS KNEE MED (STOCKING) ×3 IMPLANT
SPIKE FLUID TRANSFER (MISCELLANEOUS) IMPLANT
SPLINT NASAL AIRWAY SILICONE (MISCELLANEOUS) ×3 IMPLANT
SPONGE GAUZE 2X2 8PLY STRL LF (GAUZE/BANDAGES/DRESSINGS) ×3 IMPLANT
SPONGE NEURO XRAY DETECT 1X3 (DISPOSABLE) ×3 IMPLANT
SUCTION FRAZIER HANDLE 10FR (MISCELLANEOUS)
SUCTION TUBE FRAZIER 10FR DISP (MISCELLANEOUS) IMPLANT
SUT CHROMIC 4 0 P 3 18 (SUTURE) ×3 IMPLANT
SUT PLAIN 4 0 ~~LOC~~ 1 (SUTURE) ×3 IMPLANT
SUT PROLENE 3 0 PS 2 (SUTURE) ×3 IMPLANT
SUT VIC AB 4-0 P-3 18XBRD (SUTURE) IMPLANT
SUT VIC AB 4-0 P3 18 (SUTURE)
SYR 50ML LL SCALE MARK (SYRINGE) ×1 IMPLANT
TOWEL GREEN STERILE FF (TOWEL DISPOSABLE) ×3 IMPLANT
TRACKER ENT INSTRUMENT (MISCELLANEOUS) ×3 IMPLANT
TRACKER ENT PATIENT (MISCELLANEOUS) ×3 IMPLANT
TUBE CONNECTING 20X1/4 (TUBING) ×3 IMPLANT
TUBE SALEM SUMP 16 FR W/ARV (TUBING) ×1 IMPLANT
TUBING STRAIGHTSHOT EPS 5PK (TUBING) ×3 IMPLANT
YANKAUER SUCT BULB TIP NO VENT (SUCTIONS) ×3 IMPLANT

## 2021-10-13 NOTE — Anesthesia Postprocedure Evaluation (Signed)
Anesthesia Post Note  Patient: Physiological scientist  Procedure(s) Performed: SINUS ENDOSCOPY WITH STEALTH NAVIGATION (Left: Nose) SEPTOPLASTY (Bilateral: Nose) TOTAL ETHMOIDECTOMY (Left: Nose) FRONTAL RECESS EXPLORATION (Left: Nose)     Patient location during evaluation: PACU Anesthesia Type: General Level of consciousness: awake and alert Pain management: pain level controlled Vital Signs Assessment: post-procedure vital signs reviewed and stable Respiratory status: spontaneous breathing, nonlabored ventilation and respiratory function stable Cardiovascular status: blood pressure returned to baseline Postop Assessment: no apparent nausea or vomiting Anesthetic complications: no   No notable events documented.  Last Vitals:  Vitals:   10/13/21 1115 10/13/21 1145  BP: 129/79 120/80  Pulse: 75 85  Resp: (!) 8 18  Temp:  36.4 C  SpO2: 97% 97%    Last Pain:  Vitals:   10/13/21 1145  TempSrc: Oral  PainSc: 0-No pain                 Marthenia Rolling

## 2021-10-13 NOTE — Discharge Instructions (Addendum)
Post Anesthesia Home Care Instructions  Activity: Get plenty of rest for the remainder of the day. A responsible individual must stay with you for 24 hours following the procedure.  For the next 24 hours, DO NOT: -Drive a car -Advertising copywriter -Drink alcoholic beverages -Take any medication unless instructed by your physician -Make any legal decisions or sign important papers.  Meals: Start with liquid foods such as gelatin or soup. Progress to regular foods as tolerated. Avoid greasy, spicy, heavy foods. If nausea and/or vomiting occur, drink only clear liquids until the nausea and/or vomiting subsides. Call your physician if vomiting continues.  Special Instructions/Symptoms: Your throat may feel dry or sore from the anesthesia or the breathing tube placed in your throat during surgery. If this causes discomfort, gargle with warm salt water. The discomfort should disappear within 24 hours.  If you had a scopolamine patch placed behind your ear for the management of post- operative nausea and/or vomiting:  1. The medication in the patch is effective for 72 hours, after which it should be removed.  Wrap patch in a tissue and discard in the trash. Wash hands thoroughly with soap and water. 2. You may remove the patch earlier than 72 hours if you experience unpleasant side effects which may include dry mouth, dizziness or visual disturbances. 3. Avoid touching the patch. Wash your hands with soap and water after contact with the patch.     No tylenol until after 1:45pm today, if needed. No ibuprofen until after 7:15pm today, if needed.  --------------  POSTOPERATIVE INSTRUCTIONS FOR PATIENTS HAVING NASAL OR SINUS OPERATIONS ACTIVITY: Restrict activity at home for the first two days, resting as much as possible. Light activity is best. You may usually return to work within a week. You should refrain from nose blowing, strenuous activity, or heavy lifting greater than 20lbs for a total of  one week after your operation.  If sneezing cannot be avoided, sneeze with your mouth open. DISCOMFORT: You may experience a dull headache and pressure along with nasal congestion and discharge. These symptoms may be worse during the first week after the operation but may last as long as two to four weeks.  Please take Tylenol or the pain medication that has been prescribed for you. Do not take aspirin or aspirin containing medications since they may cause bleeding.  You may experience symptoms of post nasal drainage, nasal congestion, headaches and fatigue for two or three months after your operation.  BLEEDING: You may have some blood tinged nasal drainage for approximately two weeks after the operation.  The discharge will be worse for the first week.  Please call our office at [phone] or go to the nearest hospital emergency room if you experience any of the following: heavy, bright red blood from your nose or mouth that lasts longer than 15 minutes or coughing up or vomiting bright red blood or blood clots. GENERAL CONSIDERATIONS: A gauze dressing will be placed on your upper lip to absorb any drainage after the operation. You may need to change this several times a day.  If you do not have very much drainage, you may remove the dressing.  Remember that you may gently wipe your nose with a tissue and sniff in, but DO NOT blow your nose. Please keep all of your postoperative appointments.  Your final results after the operation will depend on proper follow-up.  The initial visit is usually 2 to 5 days after the operation.  During this visit, the remaining  nasal packing and internal septal splints will be removed.  Your nasal and sinus cavities will be cleaned.  During the second visit, your nasal and sinus cavities will be cleaned again. Have someone drive you to your first two postoperative appointments.  How you care for your nose after the operation will influence the results that you obtain.  You  should follow all directions, take your medication as prescribed, and call our office [phone] with any problems or questions. You may be more comfortable sleeping with your head elevated on two pillows. Do not take any medications that we have not prescribed or recommended. WARNING SIGNS: if any of the following should occur, please call our office: Persistent fever greater than 102F. Persistent vomiting. Severe and constant pain that is not relieved by prescribed pain medication. Trauma to the nose. Rash or unusual side effects from any medicines.

## 2021-10-13 NOTE — H&P (Signed)
Cc: Septal deviation, chronic rhinosinusitis and ethmoid mucocele  HPI: The patient is a 36 year old female who returns today for her follow-up evaluation.  She was last seen in December 2022.  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  The patient subsequently underwent a sinus CT scan.  The CT showed a left ethmoid mucocele and complete opacification of the left frontal sinus, left frontoethmoidal recess, and anterior left ethmoid air cell. The patient returns today reporting nasal congestion and occasional facial pressure.  She denies any fever or visual change.  Exam: General: Communicates without difficulty, well nourished, no acute distress. Head: Normocephalic, no evidence injury, no tenderness, facial buttresses intact without stepoff. Eyes: PERRL, EOMI. No scleral icterus, conjunctivae clear. Neuro: CN II exam reveals vision grossly intact.  No nystagmus at any point of gaze. Ears: Auricles well formed without lesions.  Ear canals are intact without mass or lesion.  No erythema or edema is appreciated.  The TMs are intact without fluid. Nose: External evaluation reveals normal support and skin without lesions.  Dorsum is intact.  Anterior rhinoscopy reveals congested and edematous mucosa over anterior aspect of the inferior turbinates and nasal septum.  No purulence is noted. Middle meatus is not well visualized. Oral:  Oral cavity and oropharynx are intact, symmetric, without erythema or edema.  Mucosa is moist without lesions. Neck: Full range of motion without pain.  There is no significant lymphadenopathy.  No masses palpable.  Thyroid bed within normal limits to palpation.  Parotid glands and submandibular glands equal bilaterally without mass.  Trachea is midline. Neuro:  CN 2-12 grossly intact. Gait normal. Vestibular: No nystagmus at any point of gaze. A flexible scope was inserted into the right nasal cavity.  Endoscopy of the  interior nasal cavity, superior, inferior, and middle meatus was performed. The sphenoid-ethmoid recess was examined. Edematous mucosa was noted.  No polyp, mass, or lesion was appreciated. Nasal septal deviation noted.  Olfactory cleft was clear.  Nasopharynx was clear.  Turbinates were hypertrophied but without mass. The procedure was repeated on the contralateral side with similar findings.  The patient tolerated the procedure well.  Assessment: 1.  Chronic left frontal and ethmoid sinusitis, with complete opacification of the left frontal sinus, left frontoethmoidal recess, and left ethmoidal air cells.  The patient also has a large left ethmoid mucocele.  The CT showed marked thinning versus dehiscence of the overlying left lamina papyracea. 2.  Nasal septal deviation, causing obstruction of the left nasal passageway.  Plan: 1.  The nasal endoscopy findings and the CT results are reviewed with the patient. 2.  Based on the above findings, the patient will benefit from undergoing left endoscopic sinus surgery to address her left frontal and ethmoid sinuses.  She will also benefit from undergoing septoplasty to treat her leftward nasal septal deviation. 3.  The risk, benefits, alternatives, and details of the procedures are extensively discussed.  Questions are invited and answered. 4.  The patient would like to proceed with the procedures.

## 2021-10-13 NOTE — Transfer of Care (Signed)
Immediate Anesthesia Transfer of Care Note  Patient: Physiological scientist  Procedure(s) Performed: SINUS ENDOSCOPY WITH STEALTH NAVIGATION (Left: Nose) SEPTOPLASTY (Bilateral: Nose) TOTAL ETHMOIDECTOMY (Left: Nose) FRONTAL RECESS EXPLORATION (Left: Nose)  Patient Location: PACU  Anesthesia Type:MAC and General  Level of Consciousness: drowsy  Airway & Oxygen Therapy: Patient Spontanous Breathing and Patient connected to face mask oxygen  Post-op Assessment: Report given to RN and Post -op Vital signs reviewed and stable  Post vital signs: Reviewed and stable  Last Vitals:  Vitals Value Taken Time  BP 119/72 10/13/21 1100  Temp 37.1 C 10/13/21 1035  Pulse 86 10/13/21 1102  Resp 17 10/13/21 1102  SpO2 97 % 10/13/21 1102  Vitals shown include unvalidated device data.  Last Pain:  Vitals:   10/13/21 0738  TempSrc: Oral  PainSc: 0-No pain      Patients Stated Pain Goal: 6 (68/25/74 [id number])  Complications: No notable events documented.

## 2021-10-13 NOTE — Anesthesia Procedure Notes (Signed)
Procedure Name: Intubation Date/Time: 10/13/2021 8:53 AM  Performed by: Ezequiel Kayser, CRNAPre-anesthesia Checklist: Patient identified, Emergency Drugs available, Suction available and Patient being monitored Patient Re-evaluated:Patient Re-evaluated prior to induction Oxygen Delivery Method: Circle System Utilized Preoxygenation: Pre-oxygenation with 100% oxygen Induction Type: IV induction Ventilation: Mask ventilation without difficulty Laryngoscope Size: Mac and 3 Grade View: Grade I Tube type: Oral Tube size: 7.0 mm Number of attempts: 1 Airway Equipment and Method: Stylet and Oral airway Placement Confirmation: ETT inserted through vocal cords under direct vision, positive ETCO2 and breath sounds checked- equal and bilateral Secured at: 22 cm Tube secured with: Tape Dental Injury: Teeth and Oropharynx as per pre-operative assessment

## 2021-10-13 NOTE — Op Note (Signed)
DATE OF PROCEDURE: 10/13/2021  OPERATIVE REPORT   SURGEON: Newman Pies, MD   PREOPERATIVE DIAGNOSES:  1. Nasal septal deviation.  2. Chronic left frontal and ethmoid sinusitis 3. Chronic nasal obstruction. 4.  Left ethmoid mucocele  POSTOPERATIVE DIAGNOSES:  1. Nasal septal deviation.  2. Chronic left frontal and ethmoid sinusitis 3. Chronic nasal obstruction. 4.  Left ethmoid mucocele  PROCEDURE PERFORMED:  1. Septoplasty.  2. Left frontal sinusotomy and left total ethmoidectomy with polyp removal 3. FUSION stereotactic image guidance  ANESTHESIA: General endotracheal tube anesthesia.   COMPLICATIONS: None.   ESTIMATED BLOOD LOSS: 150 mL.   INDICATION FOR PROCEDURE: Jalaiya Oyster is a 36 y.o. female with a history of chronic nasal congestion, septal deviation, and ethmoid mucocele.  The patient subsequently underwent a sinus CT scan.  The CT showed a left ethmoid mucocele and complete opacification of the left frontal sinus, left frontoethmoidal recess, and anterior left ethmoid air cell. The patient continued to have nasal congestion and facial pressure despite medical treatment. Based on the above findings, the decision was made for the patient to undergo the above-stated procedures. The risks, benefits, alternatives, and details of the procedures were discussed with the patient. Questions were invited and answered. Informed consent was obtained.   DESCRIPTION OF PROCEDURE: The patient was taken to the operating room and placed supine on the operating table. General endotracheal tube anesthesia was administered by the anesthesiologist. The patient was positioned, and prepped and draped in the standard fashion for nasal surgery. Pledgets soaked with Afrin were placed in both nasal cavities for decongestion. The pledgets were subsequently removed. The FUSION stereotactic image guidance marker was placed. The image guidance system was functional throughout the case.  Examination of the  nasal cavity revealed a severe nasal septal deviation. 1% lidocaine with 1:100,000 epinephrine was injected onto the nasal septum bilaterally. A hemitransfixion incision was made on the left side. The mucosal flap was carefully elevated on the left side. A cartilaginous incision was made 1 cm superior to the caudal margin of the nasal septum. Mucosal flap was also elevated on the right side in the similar fashion. It should be noted that due to the severe septal deviation, the deviated portion of the cartilaginous and bony septum had to be removed in piecemeal fashion. Once the deviated portions were removed, a straight midline septum was achieved. The septum was then quilted with 4-0 plain gut sutures. The hemitransfixion incision was closed with interrupted 4-0 chromic sutures.   Using a 0 endoscope, the left nasal cavity was examined. A large middle turbinate was noted. Using Tru-Cut forceps, the inferior one third of the middle turbinate was resected. Polypoid tissue was noted within the middle meatus. The polypoid tissue was removed using a combination of microdebrider and Blakesley forceps. Attention was then focused on the ethmoid sinuses. The bony partitions of the anterior and posterior ethmoid cavities were taken down.  A large mucocele was noted.  The mucocele was marsupialized and mucoid fluid was suctioned and drained.  Attention was then focused on the frontal sinus. The frontal recess was identified and enlarged by removing the surrounding bony partitions. Polypoid tissue was removed from the frontal recess.  The sinuses were irrigated with saline solution. Doyle splints were applied to the nasal septum.  The care of the patient was turned over to the anesthesiologist. The patient was awakened from anesthesia without difficulty. The patient was extubated and transferred to the recovery room in good condition.   OPERATIVE FINDINGS: Nasal  septal deviation.  Chronic left ethmoid and frontal  sinusitis with left ethmoid mucocele.  SPECIMEN: Left sinus contents.   FOLLOWUP CARE: The patient be discharged home once she is awake and alert. The patient will be placed on vicodin p.r.n. pain, and amoxicillin 875 mg p.o. b.i.d. for 3 days. The patient will follow up in my office in 3 days for splint removal.   Shanti Agresti Philomena Doheny, MD

## 2021-10-14 ENCOUNTER — Encounter (HOSPITAL_BASED_OUTPATIENT_CLINIC_OR_DEPARTMENT_OTHER): Payer: Self-pay | Admitting: Otolaryngology

## 2021-10-14 LAB — SURGICAL PATHOLOGY

## 2021-10-16 ENCOUNTER — Other Ambulatory Visit (HOSPITAL_COMMUNITY): Payer: Self-pay

## 2021-10-16 DIAGNOSIS — J338 Other polyp of sinus: Secondary | ICD-10-CM | POA: Diagnosis not present

## 2021-10-16 DIAGNOSIS — J322 Chronic ethmoidal sinusitis: Secondary | ICD-10-CM | POA: Diagnosis not present

## 2021-10-16 DIAGNOSIS — J321 Chronic frontal sinusitis: Secondary | ICD-10-CM | POA: Diagnosis not present

## 2021-10-16 MED ORDER — OXYCODONE-ACETAMINOPHEN 5-325 MG PO TABS
1.0000 | ORAL_TABLET | Freq: Four times a day (QID) | ORAL | 0 refills | Status: DC | PRN
  Filled 2021-10-16: qty 12, 3d supply, fill #0

## 2021-10-16 MED ORDER — HYDROCODONE-ACETAMINOPHEN 5-325 MG PO TABS
1.0000 | ORAL_TABLET | Freq: Four times a day (QID) | ORAL | 0 refills | Status: DC | PRN
  Filled 2021-10-16: qty 12, 3d supply, fill #0

## 2021-10-27 ENCOUNTER — Other Ambulatory Visit (HOSPITAL_COMMUNITY): Payer: Self-pay

## 2021-10-28 ENCOUNTER — Other Ambulatory Visit (HOSPITAL_COMMUNITY): Payer: Self-pay

## 2022-01-21 ENCOUNTER — Other Ambulatory Visit (HOSPITAL_COMMUNITY): Payer: Self-pay

## 2022-01-21 MED ORDER — METOPROLOL TARTRATE 25 MG PO TABS
25.0000 mg | ORAL_TABLET | ORAL | 3 refills | Status: AC | PRN
  Filled 2022-01-21: qty 30, 30d supply, fill #0

## 2022-01-28 ENCOUNTER — Other Ambulatory Visit (HOSPITAL_COMMUNITY): Payer: Self-pay

## 2022-01-29 DIAGNOSIS — E569 Vitamin deficiency, unspecified: Secondary | ICD-10-CM | POA: Diagnosis not present

## 2022-01-29 DIAGNOSIS — R002 Palpitations: Secondary | ICD-10-CM | POA: Diagnosis not present

## 2022-01-29 DIAGNOSIS — F419 Anxiety disorder, unspecified: Secondary | ICD-10-CM | POA: Diagnosis not present

## 2022-01-29 DIAGNOSIS — U099 Post covid-19 condition, unspecified: Secondary | ICD-10-CM | POA: Diagnosis not present

## 2022-01-29 DIAGNOSIS — G479 Sleep disorder, unspecified: Secondary | ICD-10-CM | POA: Diagnosis not present

## 2022-01-29 DIAGNOSIS — F32A Depression, unspecified: Secondary | ICD-10-CM | POA: Diagnosis not present

## 2022-01-29 DIAGNOSIS — G90A Postural orthostatic tachycardia syndrome (POTS): Secondary | ICD-10-CM | POA: Diagnosis not present

## 2022-01-29 DIAGNOSIS — K5989 Other specified functional intestinal disorders: Secondary | ICD-10-CM | POA: Diagnosis not present

## 2022-02-05 ENCOUNTER — Other Ambulatory Visit (HOSPITAL_COMMUNITY): Payer: Self-pay

## 2022-02-06 ENCOUNTER — Other Ambulatory Visit (HOSPITAL_COMMUNITY): Payer: Self-pay

## 2022-02-10 ENCOUNTER — Other Ambulatory Visit (HOSPITAL_COMMUNITY): Payer: Self-pay

## 2022-02-10 MED ORDER — DULOXETINE HCL 20 MG PO CPEP
40.0000 mg | ORAL_CAPSULE | Freq: Every day | ORAL | 5 refills | Status: DC
  Filled 2022-02-10: qty 180, 90d supply, fill #0
  Filled 2022-04-06 – 2022-05-07 (×2): qty 180, 90d supply, fill #1
  Filled 2022-07-09: qty 180, 90d supply, fill #2

## 2022-02-10 MED ORDER — VALACYCLOVIR HCL 1 G PO TABS
ORAL_TABLET | ORAL | 1 refills | Status: AC
  Filled 2022-02-10: qty 60, 30d supply, fill #0

## 2022-02-11 ENCOUNTER — Other Ambulatory Visit (HOSPITAL_COMMUNITY): Payer: Self-pay

## 2022-02-11 MED ORDER — IVERMECTIN 3 MG PO TABS
9.0000 mg | ORAL_TABLET | Freq: Every day | ORAL | 0 refills | Status: DC
  Filled 2022-02-11: qty 15, 5d supply, fill #0

## 2022-02-13 ENCOUNTER — Other Ambulatory Visit (HOSPITAL_COMMUNITY): Payer: Self-pay

## 2022-02-13 MED ORDER — VALACYCLOVIR HCL 1 G PO TABS: 1000.0000 mg | ORAL_TABLET | Freq: Every day | ORAL | 1 refills | Status: DC

## 2022-02-18 DIAGNOSIS — Z01419 Encounter for gynecological examination (general) (routine) without abnormal findings: Secondary | ICD-10-CM | POA: Diagnosis not present

## 2022-02-18 DIAGNOSIS — N895 Stricture and atresia of vagina: Secondary | ICD-10-CM | POA: Diagnosis not present

## 2022-02-18 DIAGNOSIS — Z719 Counseling, unspecified: Secondary | ICD-10-CM | POA: Diagnosis not present

## 2022-02-18 DIAGNOSIS — Z2821 Immunization not carried out because of patient refusal: Secondary | ICD-10-CM | POA: Diagnosis not present

## 2022-02-22 DIAGNOSIS — A09 Infectious gastroenteritis and colitis, unspecified: Secondary | ICD-10-CM | POA: Diagnosis not present

## 2022-02-22 DIAGNOSIS — R112 Nausea with vomiting, unspecified: Secondary | ICD-10-CM | POA: Diagnosis not present

## 2022-02-22 DIAGNOSIS — R197 Diarrhea, unspecified: Secondary | ICD-10-CM | POA: Diagnosis not present

## 2022-02-22 DIAGNOSIS — Z885 Allergy status to narcotic agent status: Secondary | ICD-10-CM | POA: Diagnosis not present

## 2022-02-22 DIAGNOSIS — R000 Tachycardia, unspecified: Secondary | ICD-10-CM | POA: Diagnosis not present

## 2022-02-23 ENCOUNTER — Other Ambulatory Visit (HOSPITAL_COMMUNITY): Payer: Self-pay

## 2022-02-23 MED ORDER — CYCLOBENZAPRINE HCL 5 MG PO TABS
5.0000 mg | ORAL_TABLET | Freq: Three times a day (TID) | ORAL | 6 refills | Status: AC | PRN
Start: 2022-02-21 — End: ?
  Filled 2022-02-23 – 2022-04-06 (×2): qty 30, 10d supply, fill #0

## 2022-03-04 ENCOUNTER — Other Ambulatory Visit (HOSPITAL_COMMUNITY): Payer: Self-pay

## 2022-04-06 ENCOUNTER — Other Ambulatory Visit (HOSPITAL_COMMUNITY): Payer: Self-pay

## 2022-06-08 DIAGNOSIS — U099 Post covid-19 condition, unspecified: Secondary | ICD-10-CM | POA: Diagnosis not present

## 2022-06-08 DIAGNOSIS — E569 Vitamin deficiency, unspecified: Secondary | ICD-10-CM | POA: Diagnosis not present

## 2022-06-08 DIAGNOSIS — F3281 Premenstrual dysphoric disorder: Secondary | ICD-10-CM | POA: Diagnosis not present

## 2022-06-08 DIAGNOSIS — R5382 Chronic fatigue, unspecified: Secondary | ICD-10-CM | POA: Diagnosis not present

## 2022-06-08 DIAGNOSIS — E618 Deficiency of other specified nutrient elements: Secondary | ICD-10-CM | POA: Diagnosis not present

## 2022-06-08 DIAGNOSIS — G479 Sleep disorder, unspecified: Secondary | ICD-10-CM | POA: Diagnosis not present

## 2022-06-08 DIAGNOSIS — F419 Anxiety disorder, unspecified: Secondary | ICD-10-CM | POA: Diagnosis not present

## 2022-06-08 DIAGNOSIS — K5989 Other specified functional intestinal disorders: Secondary | ICD-10-CM | POA: Diagnosis not present

## 2022-06-08 DIAGNOSIS — F410 Panic disorder [episodic paroxysmal anxiety] without agoraphobia: Secondary | ICD-10-CM | POA: Diagnosis not present

## 2022-06-08 DIAGNOSIS — B349 Viral infection, unspecified: Secondary | ICD-10-CM | POA: Diagnosis not present

## 2022-06-24 DIAGNOSIS — K5989 Other specified functional intestinal disorders: Secondary | ICD-10-CM | POA: Diagnosis not present

## 2022-06-24 DIAGNOSIS — G90A Postural orthostatic tachycardia syndrome (POTS): Secondary | ICD-10-CM | POA: Diagnosis not present

## 2022-06-24 DIAGNOSIS — G479 Sleep disorder, unspecified: Secondary | ICD-10-CM | POA: Diagnosis not present

## 2022-06-24 DIAGNOSIS — E569 Vitamin deficiency, unspecified: Secondary | ICD-10-CM | POA: Diagnosis not present

## 2022-06-24 DIAGNOSIS — F419 Anxiety disorder, unspecified: Secondary | ICD-10-CM | POA: Diagnosis not present

## 2022-06-24 DIAGNOSIS — E618 Deficiency of other specified nutrient elements: Secondary | ICD-10-CM | POA: Diagnosis not present

## 2022-06-24 DIAGNOSIS — F3281 Premenstrual dysphoric disorder: Secondary | ICD-10-CM | POA: Diagnosis not present

## 2022-06-24 DIAGNOSIS — R5382 Chronic fatigue, unspecified: Secondary | ICD-10-CM | POA: Diagnosis not present

## 2022-06-24 DIAGNOSIS — F410 Panic disorder [episodic paroxysmal anxiety] without agoraphobia: Secondary | ICD-10-CM | POA: Diagnosis not present

## 2022-06-24 DIAGNOSIS — U099 Post covid-19 condition, unspecified: Secondary | ICD-10-CM | POA: Diagnosis not present

## 2022-07-09 ENCOUNTER — Other Ambulatory Visit (HOSPITAL_COMMUNITY): Payer: Self-pay

## 2022-08-15 ENCOUNTER — Other Ambulatory Visit (HOSPITAL_COMMUNITY): Payer: Self-pay

## 2022-08-16 MED ORDER — DULOXETINE HCL 20 MG PO CPEP
40.0000 mg | ORAL_CAPSULE | Freq: Every day | ORAL | 5 refills | Status: DC
  Filled 2022-08-16: qty 180, 90d supply, fill #0
  Filled 2022-08-19: qty 60, 30d supply, fill #0
  Filled 2022-09-22: qty 60, 30d supply, fill #1
  Filled 2023-01-28: qty 60, 30d supply, fill #2

## 2022-08-17 ENCOUNTER — Other Ambulatory Visit (HOSPITAL_COMMUNITY): Payer: Self-pay

## 2022-08-18 ENCOUNTER — Other Ambulatory Visit (HOSPITAL_COMMUNITY): Payer: Self-pay

## 2022-08-18 DIAGNOSIS — Z719 Counseling, unspecified: Secondary | ICD-10-CM | POA: Diagnosis not present

## 2022-08-18 DIAGNOSIS — Z2821 Immunization not carried out because of patient refusal: Secondary | ICD-10-CM | POA: Diagnosis not present

## 2022-08-18 DIAGNOSIS — F32A Depression, unspecified: Secondary | ICD-10-CM | POA: Diagnosis not present

## 2022-08-18 DIAGNOSIS — F419 Anxiety disorder, unspecified: Secondary | ICD-10-CM | POA: Diagnosis not present

## 2022-08-18 MED ORDER — FLUOXETINE HCL 40 MG PO CAPS
40.0000 mg | ORAL_CAPSULE | Freq: Every day | ORAL | 0 refills | Status: DC
  Filled 2022-08-18: qty 30, 30d supply, fill #0

## 2022-08-19 ENCOUNTER — Other Ambulatory Visit (HOSPITAL_COMMUNITY): Payer: Self-pay

## 2022-08-20 ENCOUNTER — Other Ambulatory Visit (HOSPITAL_COMMUNITY): Payer: Self-pay

## 2022-08-28 ENCOUNTER — Other Ambulatory Visit: Payer: Self-pay

## 2022-09-03 ENCOUNTER — Other Ambulatory Visit: Payer: Self-pay

## 2022-10-26 ENCOUNTER — Other Ambulatory Visit (HOSPITAL_COMMUNITY): Payer: Self-pay

## 2022-10-26 DIAGNOSIS — Z719 Counseling, unspecified: Secondary | ICD-10-CM | POA: Diagnosis not present

## 2022-10-26 DIAGNOSIS — R350 Frequency of micturition: Secondary | ICD-10-CM | POA: Diagnosis not present

## 2022-10-26 DIAGNOSIS — R5383 Other fatigue: Secondary | ICD-10-CM | POA: Diagnosis not present

## 2022-10-26 DIAGNOSIS — Z2821 Immunization not carried out because of patient refusal: Secondary | ICD-10-CM | POA: Diagnosis not present

## 2022-10-26 DIAGNOSIS — Z0000 Encounter for general adult medical examination without abnormal findings: Secondary | ICD-10-CM | POA: Diagnosis not present

## 2022-10-26 MED ORDER — FLUOXETINE HCL 10 MG PO CAPS
20.0000 mg | ORAL_CAPSULE | Freq: Every day | ORAL | 1 refills | Status: DC
  Filled 2022-10-26 – 2022-10-27 (×3): qty 60, 30d supply, fill #0

## 2022-10-26 MED ORDER — TRAZODONE HCL 50 MG PO TABS
100.0000 mg | ORAL_TABLET | Freq: Every day | ORAL | 1 refills | Status: DC
  Filled 2022-10-26 – 2022-10-27 (×3): qty 60, 30d supply, fill #0

## 2022-10-27 ENCOUNTER — Other Ambulatory Visit: Payer: Self-pay

## 2022-10-27 ENCOUNTER — Other Ambulatory Visit (HOSPITAL_COMMUNITY): Payer: Self-pay

## 2022-10-28 ENCOUNTER — Other Ambulatory Visit: Payer: Self-pay

## 2022-11-11 ENCOUNTER — Other Ambulatory Visit (HOSPITAL_COMMUNITY): Payer: Self-pay

## 2022-11-11 MED ORDER — ESCITALOPRAM OXALATE 10 MG PO TABS
10.0000 mg | ORAL_TABLET | Freq: Every day | ORAL | 1 refills | Status: DC
  Filled 2022-11-11 – 2022-11-18 (×2): qty 30, 30d supply, fill #0

## 2022-11-18 ENCOUNTER — Other Ambulatory Visit: Payer: Self-pay

## 2022-11-18 ENCOUNTER — Other Ambulatory Visit (HOSPITAL_COMMUNITY): Payer: Self-pay

## 2023-01-26 ENCOUNTER — Other Ambulatory Visit (HOSPITAL_COMMUNITY): Payer: Self-pay

## 2023-01-26 DIAGNOSIS — Z30432 Encounter for removal of intrauterine contraceptive device: Secondary | ICD-10-CM | POA: Diagnosis not present

## 2023-01-26 DIAGNOSIS — Z3009 Encounter for other general counseling and advice on contraception: Secondary | ICD-10-CM | POA: Diagnosis not present

## 2023-01-26 DIAGNOSIS — Z719 Counseling, unspecified: Secondary | ICD-10-CM | POA: Diagnosis not present

## 2023-01-26 MED ORDER — BUSPIRONE HCL 5 MG PO TABS
5.0000 mg | ORAL_TABLET | Freq: Two times a day (BID) | ORAL | 0 refills | Status: DC
  Filled 2023-01-26: qty 60, 30d supply, fill #0

## 2023-01-28 ENCOUNTER — Other Ambulatory Visit (HOSPITAL_COMMUNITY): Payer: Self-pay

## 2023-02-08 ENCOUNTER — Other Ambulatory Visit: Payer: Self-pay

## 2023-02-15 ENCOUNTER — Other Ambulatory Visit (HOSPITAL_COMMUNITY): Payer: Self-pay

## 2023-03-16 DIAGNOSIS — Z01419 Encounter for gynecological examination (general) (routine) without abnormal findings: Secondary | ICD-10-CM | POA: Diagnosis not present

## 2023-03-16 DIAGNOSIS — Z01411 Encounter for gynecological examination (general) (routine) with abnormal findings: Secondary | ICD-10-CM | POA: Diagnosis not present

## 2023-03-16 DIAGNOSIS — Z113 Encounter for screening for infections with a predominantly sexual mode of transmission: Secondary | ICD-10-CM | POA: Diagnosis not present

## 2023-03-16 DIAGNOSIS — Z124 Encounter for screening for malignant neoplasm of cervix: Secondary | ICD-10-CM | POA: Diagnosis not present

## 2023-03-17 ENCOUNTER — Other Ambulatory Visit (HOSPITAL_COMMUNITY): Payer: Self-pay

## 2023-03-17 MED ORDER — IMIQUIMOD 5 % EX CREA
1.0000 | TOPICAL_CREAM | CUTANEOUS | 3 refills | Status: DC
  Filled 2023-03-17: qty 24, 30d supply, fill #0
  Filled 2023-03-18: qty 12, 30d supply, fill #0

## 2023-03-18 ENCOUNTER — Other Ambulatory Visit (HOSPITAL_COMMUNITY): Payer: Self-pay

## 2023-03-18 ENCOUNTER — Other Ambulatory Visit: Payer: Self-pay

## 2023-04-02 DIAGNOSIS — G479 Sleep disorder, unspecified: Secondary | ICD-10-CM | POA: Diagnosis not present

## 2023-04-02 DIAGNOSIS — E039 Hypothyroidism, unspecified: Secondary | ICD-10-CM | POA: Diagnosis not present

## 2023-04-02 DIAGNOSIS — R5382 Chronic fatigue, unspecified: Secondary | ICD-10-CM | POA: Diagnosis not present

## 2023-04-02 DIAGNOSIS — N959 Unspecified menopausal and perimenopausal disorder: Secondary | ICD-10-CM | POA: Diagnosis not present

## 2023-04-16 ENCOUNTER — Other Ambulatory Visit (HOSPITAL_COMMUNITY): Payer: Self-pay

## 2023-04-16 MED ORDER — DULOXETINE HCL 20 MG PO CPEP
40.0000 mg | ORAL_CAPSULE | Freq: Every day | ORAL | 5 refills | Status: DC
  Filled 2023-04-16: qty 180, 90d supply, fill #0

## 2023-05-17 DIAGNOSIS — Z3200 Encounter for pregnancy test, result unknown: Secondary | ICD-10-CM | POA: Diagnosis not present

## 2023-06-04 DIAGNOSIS — Z3A00 Weeks of gestation of pregnancy not specified: Secondary | ICD-10-CM | POA: Diagnosis not present

## 2023-06-04 DIAGNOSIS — O2691 Pregnancy related conditions, unspecified, first trimester: Secondary | ICD-10-CM | POA: Diagnosis not present

## 2023-06-04 DIAGNOSIS — O209 Hemorrhage in early pregnancy, unspecified: Secondary | ICD-10-CM | POA: Diagnosis not present

## 2023-06-07 DIAGNOSIS — O021 Missed abortion: Secondary | ICD-10-CM | POA: Diagnosis not present

## 2023-06-11 DIAGNOSIS — O039 Complete or unspecified spontaneous abortion without complication: Secondary | ICD-10-CM | POA: Diagnosis not present

## 2023-06-11 DIAGNOSIS — O021 Missed abortion: Secondary | ICD-10-CM | POA: Diagnosis not present

## 2023-06-11 DIAGNOSIS — R109 Unspecified abdominal pain: Secondary | ICD-10-CM | POA: Diagnosis not present

## 2023-06-11 DIAGNOSIS — R350 Frequency of micturition: Secondary | ICD-10-CM | POA: Diagnosis not present

## 2023-06-11 DIAGNOSIS — N898 Other specified noninflammatory disorders of vagina: Secondary | ICD-10-CM | POA: Diagnosis not present

## 2023-06-14 DIAGNOSIS — R1032 Left lower quadrant pain: Secondary | ICD-10-CM | POA: Diagnosis not present

## 2023-06-14 DIAGNOSIS — O039 Complete or unspecified spontaneous abortion without complication: Secondary | ICD-10-CM | POA: Diagnosis not present

## 2023-06-14 DIAGNOSIS — O021 Missed abortion: Secondary | ICD-10-CM | POA: Diagnosis not present

## 2023-06-14 DIAGNOSIS — O2690 Pregnancy related conditions, unspecified, unspecified trimester: Secondary | ICD-10-CM | POA: Diagnosis not present

## 2023-06-16 DIAGNOSIS — F419 Anxiety disorder, unspecified: Secondary | ICD-10-CM | POA: Diagnosis not present

## 2023-06-16 DIAGNOSIS — F410 Panic disorder [episodic paroxysmal anxiety] without agoraphobia: Secondary | ICD-10-CM | POA: Diagnosis not present

## 2023-06-16 DIAGNOSIS — R197 Diarrhea, unspecified: Secondary | ICD-10-CM | POA: Diagnosis not present

## 2023-06-16 DIAGNOSIS — R112 Nausea with vomiting, unspecified: Secondary | ICD-10-CM | POA: Diagnosis not present

## 2023-06-25 DIAGNOSIS — R197 Diarrhea, unspecified: Secondary | ICD-10-CM | POA: Diagnosis not present

## 2023-06-25 DIAGNOSIS — Z8659 Personal history of other mental and behavioral disorders: Secondary | ICD-10-CM | POA: Diagnosis not present

## 2023-06-25 DIAGNOSIS — F410 Panic disorder [episodic paroxysmal anxiety] without agoraphobia: Secondary | ICD-10-CM | POA: Diagnosis not present

## 2023-06-25 DIAGNOSIS — R1110 Vomiting, unspecified: Secondary | ICD-10-CM | POA: Diagnosis not present

## 2023-07-05 ENCOUNTER — Other Ambulatory Visit (HOSPITAL_COMMUNITY): Payer: Self-pay

## 2023-07-05 DIAGNOSIS — O2690 Pregnancy related conditions, unspecified, unspecified trimester: Secondary | ICD-10-CM | POA: Diagnosis not present

## 2023-07-05 DIAGNOSIS — Z3A00 Weeks of gestation of pregnancy not specified: Secondary | ICD-10-CM | POA: Diagnosis not present

## 2023-07-05 MED ORDER — ALPRAZOLAM 0.5 MG PO TABS
0.5000 mg | ORAL_TABLET | Freq: Every evening | ORAL | 0 refills | Status: DC
  Filled 2023-07-05: qty 10, 10d supply, fill #0

## 2023-07-05 MED ORDER — BUSPIRONE HCL 7.5 MG PO TABS
7.5000 mg | ORAL_TABLET | Freq: Two times a day (BID) | ORAL | 1 refills | Status: DC
Start: 2023-07-05 — End: 2023-12-23
  Filled 2023-07-05: qty 60, 30d supply, fill #0

## 2023-07-09 DIAGNOSIS — R197 Diarrhea, unspecified: Secondary | ICD-10-CM | POA: Diagnosis not present

## 2023-07-15 DIAGNOSIS — F331 Major depressive disorder, recurrent, moderate: Secondary | ICD-10-CM | POA: Diagnosis not present

## 2023-07-17 ENCOUNTER — Other Ambulatory Visit (HOSPITAL_COMMUNITY): Payer: Self-pay

## 2023-07-17 MED ORDER — ALPRAZOLAM 0.5 MG PO TABS
0.5000 mg | ORAL_TABLET | Freq: Every day | ORAL | 0 refills | Status: AC
  Filled 2023-07-17: qty 10, 10d supply, fill #0

## 2023-07-19 ENCOUNTER — Other Ambulatory Visit (HOSPITAL_COMMUNITY): Payer: Self-pay

## 2023-07-29 DIAGNOSIS — F331 Major depressive disorder, recurrent, moderate: Secondary | ICD-10-CM | POA: Diagnosis not present

## 2023-08-04 DIAGNOSIS — F419 Anxiety disorder, unspecified: Secondary | ICD-10-CM | POA: Diagnosis not present

## 2023-08-04 DIAGNOSIS — F331 Major depressive disorder, recurrent, moderate: Secondary | ICD-10-CM | POA: Diagnosis not present

## 2023-08-04 DIAGNOSIS — F3281 Premenstrual dysphoric disorder: Secondary | ICD-10-CM | POA: Diagnosis not present

## 2023-08-04 DIAGNOSIS — N809 Endometriosis, unspecified: Secondary | ICD-10-CM | POA: Diagnosis not present

## 2023-08-04 DIAGNOSIS — N83209 Unspecified ovarian cyst, unspecified side: Secondary | ICD-10-CM | POA: Diagnosis not present

## 2023-08-10 DIAGNOSIS — F331 Major depressive disorder, recurrent, moderate: Secondary | ICD-10-CM | POA: Diagnosis not present

## 2023-08-17 DIAGNOSIS — F331 Major depressive disorder, recurrent, moderate: Secondary | ICD-10-CM | POA: Diagnosis not present

## 2023-08-21 ENCOUNTER — Other Ambulatory Visit (HOSPITAL_COMMUNITY): Payer: Self-pay

## 2023-08-23 ENCOUNTER — Encounter (HOSPITAL_COMMUNITY): Payer: Self-pay

## 2023-08-23 ENCOUNTER — Other Ambulatory Visit (HOSPITAL_COMMUNITY): Payer: Self-pay

## 2023-08-23 MED ORDER — DULOXETINE HCL 20 MG PO CPEP
40.0000 mg | ORAL_CAPSULE | Freq: Every day | ORAL | 6 refills | Status: DC
  Filled 2023-08-23: qty 60, 30d supply, fill #0
  Filled 2023-09-21 – 2023-10-06 (×2): qty 60, 30d supply, fill #1

## 2023-09-02 DIAGNOSIS — F331 Major depressive disorder, recurrent, moderate: Secondary | ICD-10-CM | POA: Diagnosis not present

## 2023-09-09 DIAGNOSIS — F331 Major depressive disorder, recurrent, moderate: Secondary | ICD-10-CM | POA: Diagnosis not present

## 2023-09-15 DIAGNOSIS — F331 Major depressive disorder, recurrent, moderate: Secondary | ICD-10-CM | POA: Diagnosis not present

## 2023-09-21 DIAGNOSIS — O209 Hemorrhage in early pregnancy, unspecified: Secondary | ICD-10-CM | POA: Diagnosis not present

## 2023-09-21 DIAGNOSIS — Z3200 Encounter for pregnancy test, result unknown: Secondary | ICD-10-CM | POA: Diagnosis not present

## 2023-09-23 DIAGNOSIS — O209 Hemorrhage in early pregnancy, unspecified: Secondary | ICD-10-CM | POA: Diagnosis not present

## 2023-09-23 DIAGNOSIS — F331 Major depressive disorder, recurrent, moderate: Secondary | ICD-10-CM | POA: Diagnosis not present

## 2023-09-23 DIAGNOSIS — Z3A00 Weeks of gestation of pregnancy not specified: Secondary | ICD-10-CM | POA: Diagnosis not present

## 2023-10-01 ENCOUNTER — Other Ambulatory Visit (HOSPITAL_COMMUNITY): Payer: Self-pay

## 2023-10-04 ENCOUNTER — Encounter: Payer: Self-pay | Admitting: Family Medicine

## 2023-10-06 ENCOUNTER — Other Ambulatory Visit (HOSPITAL_COMMUNITY): Payer: Self-pay

## 2023-10-07 DIAGNOSIS — F331 Major depressive disorder, recurrent, moderate: Secondary | ICD-10-CM | POA: Diagnosis not present

## 2023-10-07 IMAGING — CT CT MAXILLOFACIAL W/O CM
3 of 5 series · 13 of 47 positions shown, 15 images · non-contrast
Comparison: None.

CLINICAL DATA: Chronic maxillary sinusitis PLJ.U ([id number])

EXAM:
CT MAXILLOFACIAL WITHOUT CONTRAST
TECHNIQUE: Multidetector CT imaging of the maxillofacial structures was
performed. Multiplanar CT image reconstructions were also generated.

[Series 2: ax standard · axial · 0.59mm/px · z∈[-76,+12]mm · 8 of 105 slices shown, 10 images]
[im 8/105  brain]
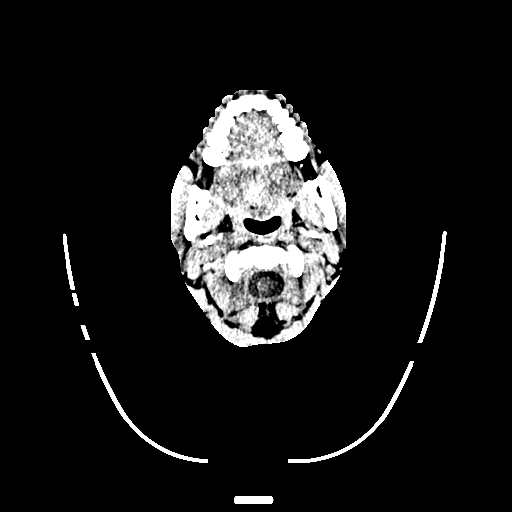
[im 8/105  bone]
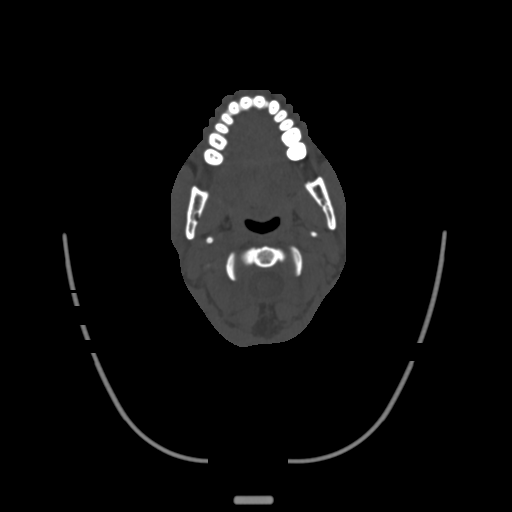
[im 22/105  bone]
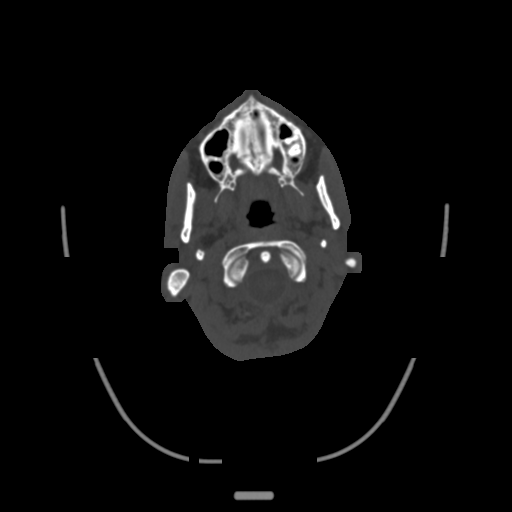
[im 33/105  bone]
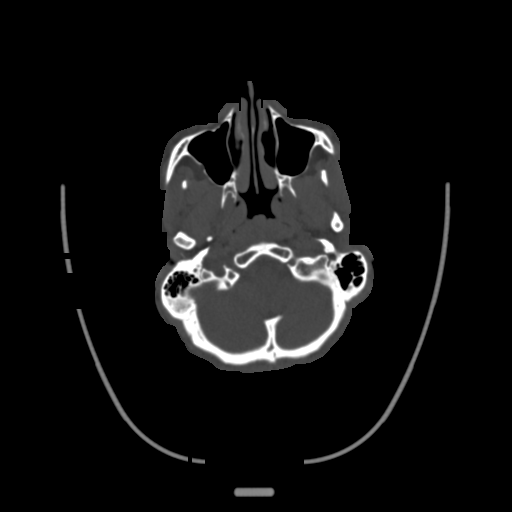
[im 47/105  bone]
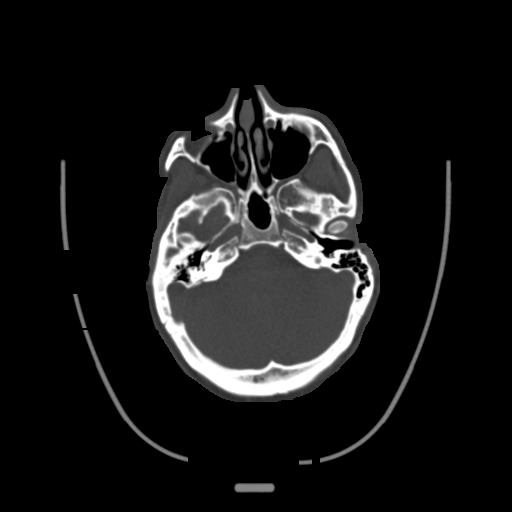
[im 58/105  brain]
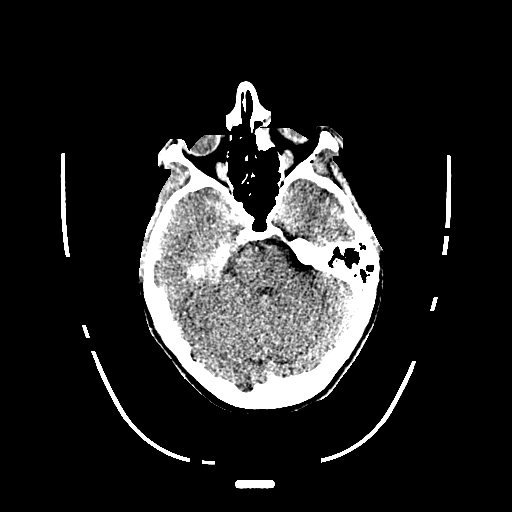
[im 58/105  bone]
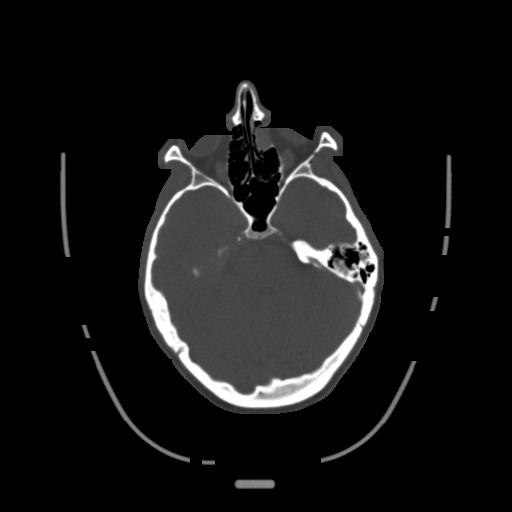
[im 72/105  bone]
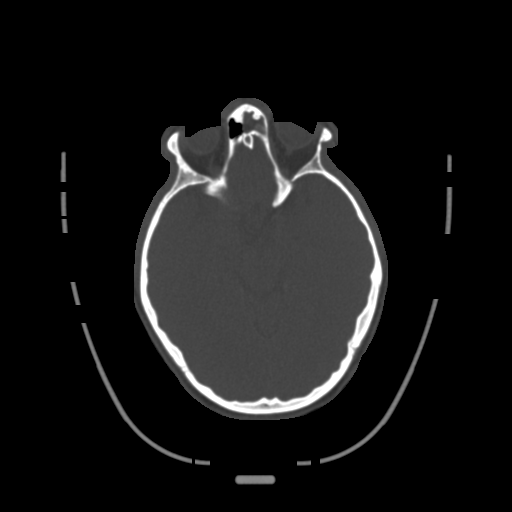
[im 83/105  bone]
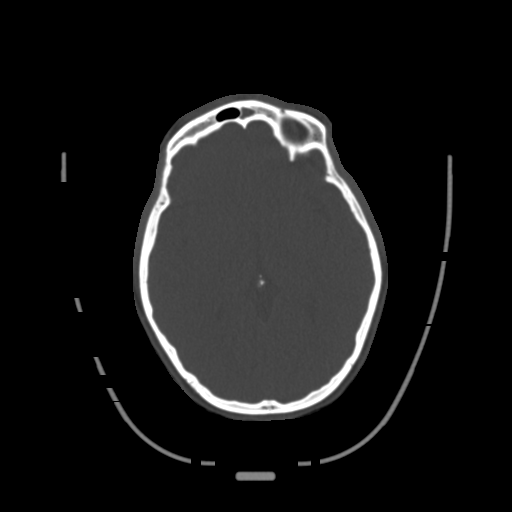
[im 97/105  bone]
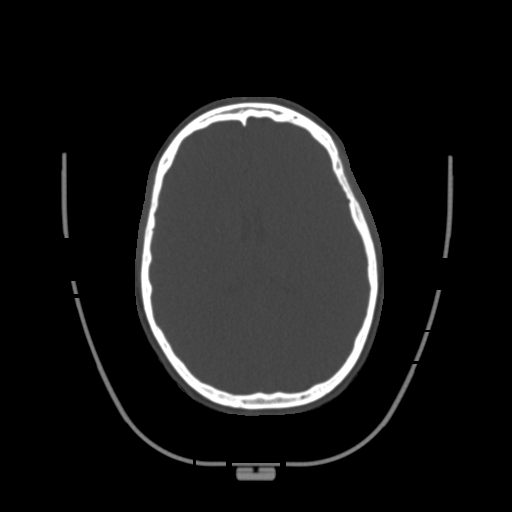

[Series 3: coronal sinus · coronal · 0.20mm/px · 3 of 109 slices shown]
[im 28/109  bone]
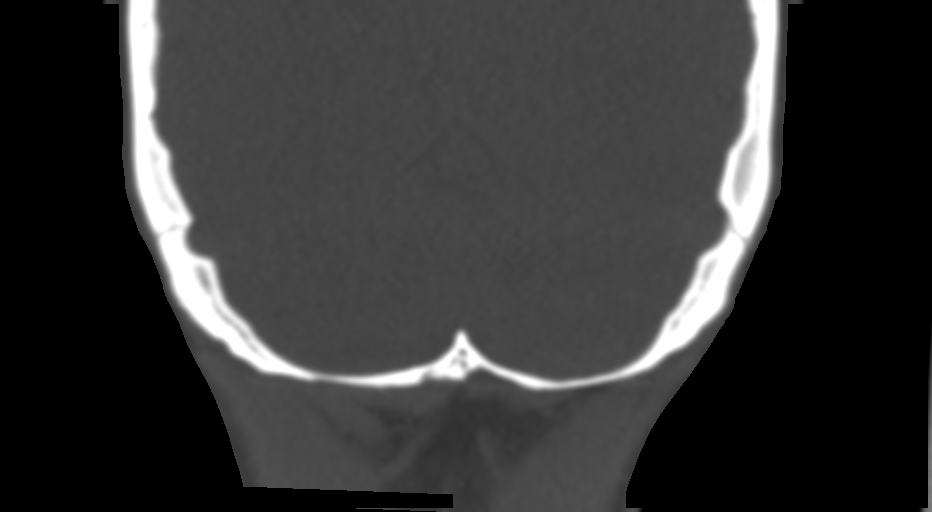
[im 55/109  bone]
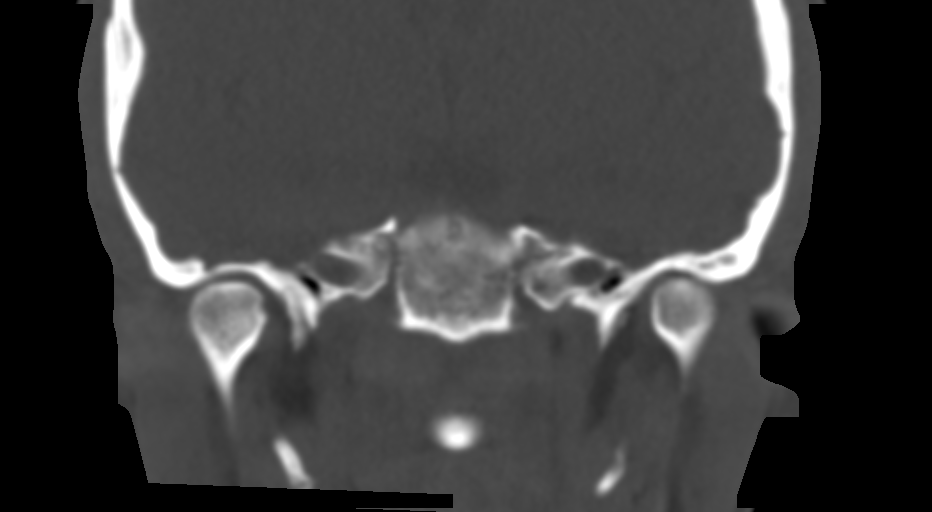
[im 82/109  bone]
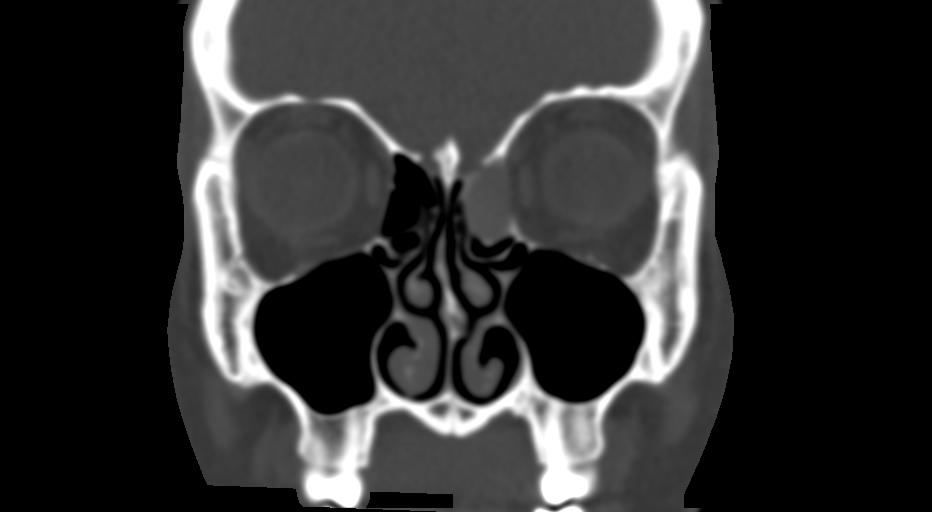

[Series 6: sag bone · sagittal · 0.20mm/px · 2 of 88 slices shown]
[im 30/88  bone]
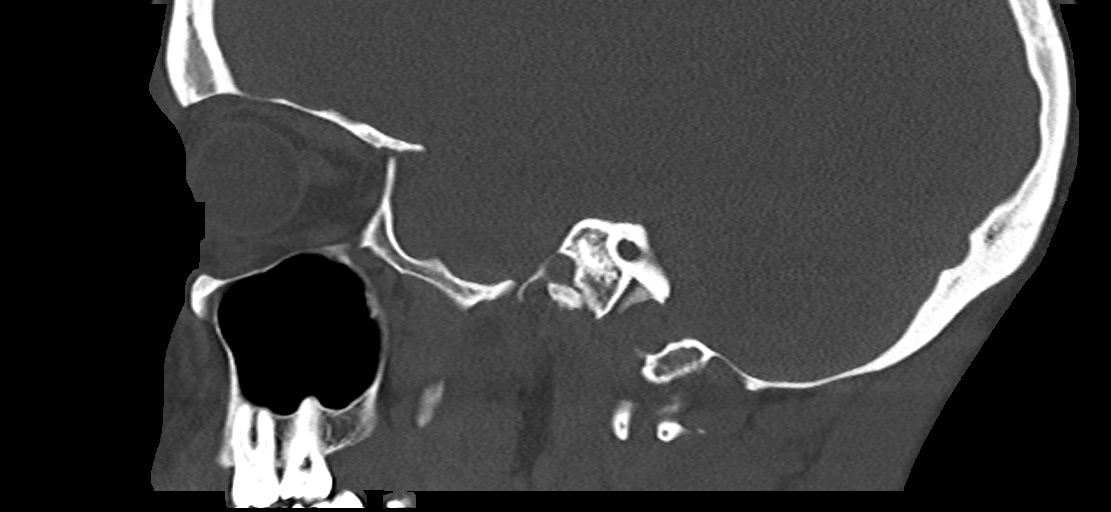
[im 59/88  bone]
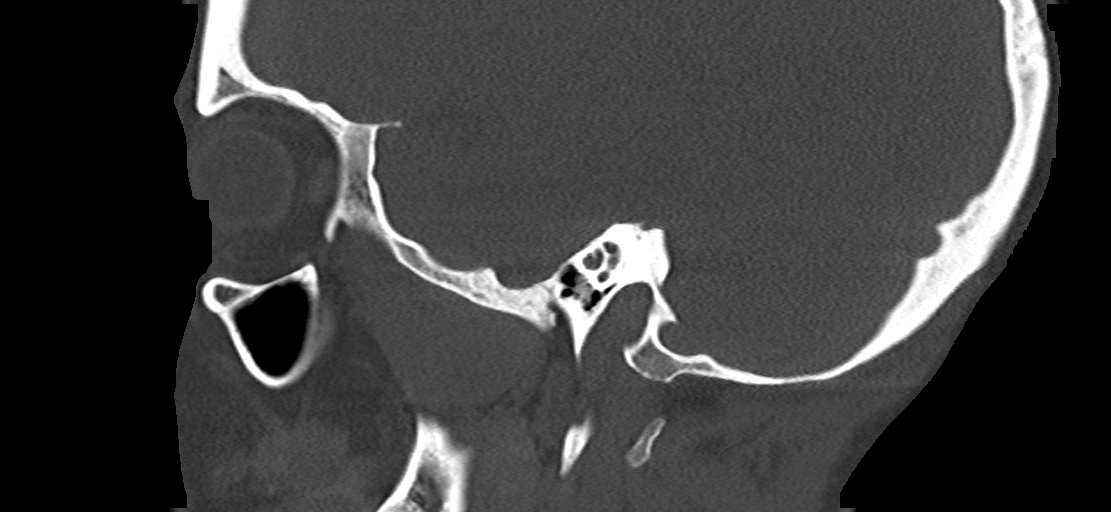

[13 of 47 positions shown; findings below may reference images not displayed]

FINDINGS: Osseous: No fracture or mandibular dislocation. No destructive
process.

Orbits: Negative. No traumatic or inflammatory finding.

Sinuses: Completely opacified left frontal sinus, left
frontoethmoidal recess, and anterior left ethmoid air cell. There is
mild expansion of the left anterior ethmoid air cell with marked
thinning or dehiscence of the overlying left lamina papyracea. No
significant extension into the orbit. Also, anterior bowing of the
ethmoid septa along the anterior aspect of the opacified air cell
which extends into the left frontoethmoidal recess. No aggressive
bony change. The remaining sinuses are clear.

Soft tissues: Negative.

Limited intracranial: Unremarkable.
IMPRESSION: Findings suggestive of a left ethmoid mucocele, as detailed above.
Notably, there is marked thinning versus dehiscence of the overlying
left lamina papyracea.

## 2023-10-12 DIAGNOSIS — F331 Major depressive disorder, recurrent, moderate: Secondary | ICD-10-CM | POA: Diagnosis not present

## 2023-10-14 DIAGNOSIS — N96 Recurrent pregnancy loss: Secondary | ICD-10-CM | POA: Diagnosis not present

## 2023-10-27 DIAGNOSIS — F331 Major depressive disorder, recurrent, moderate: Secondary | ICD-10-CM | POA: Diagnosis not present

## 2023-11-02 DIAGNOSIS — F331 Major depressive disorder, recurrent, moderate: Secondary | ICD-10-CM | POA: Diagnosis not present

## 2023-11-23 DIAGNOSIS — F331 Major depressive disorder, recurrent, moderate: Secondary | ICD-10-CM | POA: Diagnosis not present

## 2023-11-29 DIAGNOSIS — F331 Major depressive disorder, recurrent, moderate: Secondary | ICD-10-CM | POA: Diagnosis not present

## 2023-12-03 DIAGNOSIS — S069X0D Unspecified intracranial injury without loss of consciousness, subsequent encounter: Secondary | ICD-10-CM | POA: Diagnosis not present

## 2023-12-03 DIAGNOSIS — D508 Other iron deficiency anemias: Secondary | ICD-10-CM | POA: Diagnosis not present

## 2023-12-03 DIAGNOSIS — E279 Disorder of adrenal gland, unspecified: Secondary | ICD-10-CM | POA: Diagnosis not present

## 2023-12-03 DIAGNOSIS — F39 Unspecified mood [affective] disorder: Secondary | ICD-10-CM | POA: Diagnosis not present

## 2023-12-03 DIAGNOSIS — E618 Deficiency of other specified nutrient elements: Secondary | ICD-10-CM | POA: Diagnosis not present

## 2023-12-03 DIAGNOSIS — N978 Female infertility of other origin: Secondary | ICD-10-CM | POA: Diagnosis not present

## 2023-12-03 DIAGNOSIS — E889 Metabolic disorder, unspecified: Secondary | ICD-10-CM | POA: Diagnosis not present

## 2023-12-03 DIAGNOSIS — B488 Other specified mycoses: Secondary | ICD-10-CM | POA: Diagnosis not present

## 2023-12-03 DIAGNOSIS — R946 Abnormal results of thyroid function studies: Secondary | ICD-10-CM | POA: Diagnosis not present

## 2023-12-03 DIAGNOSIS — E538 Deficiency of other specified B group vitamins: Secondary | ICD-10-CM | POA: Diagnosis not present

## 2023-12-14 ENCOUNTER — Other Ambulatory Visit (HOSPITAL_COMMUNITY): Payer: Self-pay

## 2023-12-14 DIAGNOSIS — F332 Major depressive disorder, recurrent severe without psychotic features: Secondary | ICD-10-CM | POA: Diagnosis not present

## 2023-12-14 DIAGNOSIS — F419 Anxiety disorder, unspecified: Secondary | ICD-10-CM | POA: Diagnosis not present

## 2023-12-14 MED ORDER — DULOXETINE HCL 20 MG PO CPEP
40.0000 mg | ORAL_CAPSULE | Freq: Every day | ORAL | 1 refills | Status: AC
  Filled 2023-12-14: qty 60, 30d supply, fill #0
  Filled 2024-04-03 – 2024-04-19 (×2): qty 60, 30d supply, fill #1

## 2023-12-14 MED ORDER — TRAZODONE HCL 50 MG PO TABS
50.0000 mg | ORAL_TABLET | Freq: Every day | ORAL | 1 refills | Status: AC
  Filled 2023-12-14: qty 30, 30d supply, fill #0

## 2023-12-15 DIAGNOSIS — F331 Major depressive disorder, recurrent, moderate: Secondary | ICD-10-CM | POA: Diagnosis not present

## 2023-12-17 ENCOUNTER — Ambulatory Visit: Admitting: Physician Assistant

## 2023-12-17 ENCOUNTER — Other Ambulatory Visit (HOSPITAL_COMMUNITY): Payer: Self-pay

## 2023-12-21 DIAGNOSIS — F331 Major depressive disorder, recurrent, moderate: Secondary | ICD-10-CM | POA: Diagnosis not present

## 2023-12-22 NOTE — Progress Notes (Unsigned)
 12/23/2023 Lauren Holmes [id number] [birth year]  Referring provider: No ref. provider found Primary GI doctor: Dr. Legrand  ASSESSMENT AND PLAN:  Left upper quadrant abdominal pain, personal history of ulcers, nausea, vomiting Started on pantoprazole  07/09/2023 and Carafate 1 g 4 times daily 07/19/2023 Hgb 13.7, MCV 90, platelets 252, WBC 5.4 negative H. pylori stool Had miscarriage, had cyst, was taking ibuprofen consistently, started LUQ, nausea, and diarrhea with dark stools, improved with protonix /carafate she took ibuprofen a month ago for a headache and symptoms started worse. Worse with greasy foods, spicy/acidic foods Has lost about 10 lbs, hair loss, has had black stool in the past - will schedule for EGD at [facility] to evaluate for PUD, NSAID induced gastritis, I discussed risks of EGD with patient today, including risk of sedation, bleeding or perforation.  Patient provides understanding and gave verbal consent to proceed. -Get Sed rate, CRP, consider fecal calprotectin or colonoscopy if abnormal -Check celiac panel -Get lipase -Will schedule for AB US  to evaluate gallbladder, consider cross sectional imaging - if negative possible anxiety/IBS -Consider SIBO testing or xifaxin trial pending results -Can do trial of IBGARD daily, will give Levsin  -FODMAP,  and lifestyle changes discussed, avoid NSAIDS  Diarrhea, history of IBS-C  negative Salmonella, Shigella, Campylobacter, E. coli, Cdiff Can have formed stool between episodes, previous negative infectious stools, associated with AB pain Post likely IBS, rule out gallbladder, IBD -will check CRP/sed rate, Consider fecal conprotectin, colonoscopy pending results -FODMAP given -Levsin  as needed -Add on benefiber - get AB US  - consider trial of xifaxin  History of spontaneous abortions Being evaluated by GYN Patient is still interested in becoming pregnant in the future  Anxiety/panic attacks associated with  nausea/vomiting Worsening since TBI about 4 years ago With a panic attack will have nausea/vomiting  She does have motion sickness 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 female with a past medical history listed below presents for evaluation of AB pain.   Discussed the use of AI scribe software for clinical note transcription with the patient, who gave verbal consent to proceed.  History of Present Illness   Lauren Holmes is a 38 year old female who presents with persistent gastrointestinal symptoms.  She has been experiencing persistent gastrointestinal discomfort characterized by a constant gnawing pain, generalized abdominal pain, excessive burping, and gas. Greasy or acidic foods exacerbate the discomfort. She has not undergone an endoscopy before.  In February, she experienced a miscarriage and took a significant amount of ibuprofen. She subsequently developed symptoms consistent with an ulcer. She was treated with Protonix  and Carafate, which resolved the symptoms after a couple of weeks. Following another miscarriage in July, she took ibuprofen for one day, which triggered a recurrence of symptoms about a month ago. Since then, she has lost almost ten pounds, her hair is falling out, and she has severe diarrhea, sometimes requiring her to stop on the side of the road.  She has a history of panic attacks, which are described as 'very GI heavy,' involving vomiting and diarrhea. 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 She has been diagnosed with IBS in the past, specifically IBS with constipation, but her current symptoms are more severe than previous episodes.  No history of GERD, reflux, or trouble swallowing prior to the ibuprofen use. No alcohol use. During the  first ulcer episode, she noticed tarry black stools, which  resolved with treatment. Her current symptoms include frequent diarrhea, occurring every three to four days, with multiple episodes in a day during flare-ups. In between flare-ups, she experiences one bowel movement per day, sometimes diarrhea, sometimes normal stools.  Family history of ulcerative colitis in her grandmother and general GI issues in female relatives. No family history of Crohn's disease, celiac disease, or other autoimmune conditions.  She is currently taking Protonix  and is almost out of her supply. She has been on Cymbalta  for years and takes trazodone  at night. She has previously used hyoscyamine  for IBS but did not find it helpful. She has stopped taking turmeric due to concerns about its impact on her GI symptoms.        She  reports that she has never smoked. She has never used smokeless tobacco. She reports current alcohol use. She reports that she does not use drugs.  RELEVANT GI HISTORY, IMAGING AND LABS: Results   LABS Campylobacter and Escherichia coli: Negative (07/2023)      CBC    Component Value Date/Time   WBC 8.0 07/03/2021 0436   RBC 4.46 07/03/2021 0436   HGB 14.3 07/03/2021 0436   HCT 41.9 07/03/2021 0436   PLT 197 07/03/2021 0436   MCV 93.9 07/03/2021 0436   MCH 32.1 07/03/2021 0436   MCHC 34.1 07/03/2021 0436   RDW 12.9 07/03/2021 0436   LYMPHSABS 0.5 (L) 07/03/2021 0436   MONOABS 0.4 07/03/2021 0436   EOSABS 0.0 07/03/2021 0436   BASOSABS 0.0 07/03/2021 0436   No results for input(s): HGB in the last 8760 hours.  CMP     Component Value Date/Time   NA 136 07/03/2021 0436   K 3.3 (L) 07/03/2021 0436   CL 104 07/03/2021 0436   CO2 23 07/03/2021 0436   GLUCOSE 150 (H) 07/03/2021 0436   BUN 15 07/03/2021 0436   CREATININE 0.61 07/03/2021 0436   CALCIUM 8.8 (L) 07/03/2021 0436   PROT 7.7 07/03/2021 0436   ALBUMIN 4.5 07/03/2021 0436   AST 22 07/03/2021 0436   ALT 30 07/03/2021 0436   ALKPHOS 44 07/03/2021 0436   BILITOT 1.1  07/03/2021 0436   GFRNONAA >60 07/03/2021 0436      Latest Ref Rng & Units 07/03/2021    4:36 AM 03/06/2021   10:25 PM  Hepatic Function  Total Protein 6.5 - 8.1 g/dL 7.7  6.5   Albumin 3.5 - 5.0 g/dL 4.5  4.0   AST 15 - 41 U/L 22  14   ALT 0 - 44 U/L 30  10   Alk Phosphatase 38 - 126 U/L 44  31   Total Bilirubin 0.3 - 1.2 mg/dL 1.1  0.9       Current Medications:    Current Outpatient Medications (Cardiovascular):    metoprolol  tartrate (LOPRESSOR ) 25 MG tablet, Take 1 tablet (25 mg total) by mouth as needed for heart palpitations.  Current Outpatient Medications (Respiratory):    promethazine  (PHENERGAN ) 25 MG tablet, Take 1 tablet (25 mg total) by mouth every 6 (six) hours as needed for nausea or vomiting.  Current Outpatient Medications (Analgesics):    SUMAtriptan (IMITREX) 25 MG tablet, Take 25 mg by mouth every 2 (two) hours as needed for migraine.   Current Outpatient Medications (Other):    ALPRAZolam  (XANAX ) 0.5 MG tablet, Take 1 tablet (0.5 mg total) by mouth at bedtime for sleep.   clonazePAM (KLONOPIN) 0.5 MG tablet, Take  0.5 mg by mouth 2 (two) times daily as needed for anxiety.   cyclobenzaprine  (FLEXERIL ) 5 MG tablet, Take 1 tablet (5 mg total) by mouth 3 (three) times daily as needed.   DULoxetine  (CYMBALTA ) 20 MG capsule, Take 2 capsules (40 mg total) by mouth at night   DULoxetine  (CYMBALTA ) 20 MG capsule, Take 2 capsules (40 mg total) by mouth at bedtime.   DULoxetine  (CYMBALTA ) 20 MG capsule, Take 2 capsules (40 mg total) by mouth at bedtime.   DULoxetine  (CYMBALTA ) 20 MG capsule, Take 2 capsules (40 mg total)every day  by oral route for 30 days.   DULoxetine  (CYMBALTA ) 20 MG capsule, Take 2 capsules (40 mg total) by mouth daily.   famotidine  (PEPCID ) 40 MG tablet, Take 1 tablet (40 mg total) by mouth at bedtime.   hyoscyamine  (LEVSIN  SL) 0.125 MG SL tablet, Place 1 tablet (0.125 mg total) under the tongue every 6 (six) hours as needed for cramping (nausea,  diarrhea).   magnesium 30 MG tablet, Take 30 mg by mouth 2 (two) times daily.   Magnesium Citrate 100 MG TABS, Take 600 mg by mouth as needed.   melatonin 3 MG TABS tablet, Take 3 mg by mouth at bedtime as needed.   Multiple Vitamin (QUINTABS) TABS, Take 1 tablet by mouth daily.   ondansetron  (ZOFRAN  ODT) 8 MG disintegrating tablet, Take 1 tablet (8 mg total) by mouth every 8 (eight) hours as needed for nausea   ondansetron  (ZOFRAN ) 4 MG tablet, Take 1 tablet (4 mg total) by mouth every 6 (six) hours.   sucralfate (CARAFATE) 1 g tablet, Take 1 g by mouth 4 (four) times daily.   traZODone  (DESYREL ) 50 MG tablet, Take 1 tablet (50 mg total) by mouth at bedtime.   pantoprazole  (PROTONIX ) 40 MG tablet, Take 1 tablet (40 mg total) by mouth 2 (two) times daily before a meal.   traZODone  (DESYREL ) 50 MG tablet, Start with 1 tablet at bedtime and increase to 2 tablets at bedtime if needed for sleep  Medical History:  Past Medical History:  Diagnosis Date   Anxiety    Complication of anesthesia    Depression    PONV (postoperative nausea and vomiting)    POTS (postural orthostatic tachycardia syndrome)    Allergies:  Allergies  Allergen Reactions   Oxycodone  Nausea And Vomiting   Hydroxyzine Itching and Rash     Surgical History:  She  has a past surgical history that includes Clavicle surgery; Hip surgery; Sinus endo with fusion (Left, 10/13/2021); Septoplasty (Bilateral, 10/13/2021); Ethmoidectomy (Left, 10/13/2021); and Frontal sinus exploration (Left, 10/13/2021). Family History:  Her family history includes Breast cancer in her paternal grandmother; Dementia in her maternal grandmother; Diabetes in her maternal grandmother; Healthy in her father, mother, and sister.  REVIEW OF SYSTEMS  : All other systems reviewed and negative except where noted in the History of Present Illness.  PHYSICAL EXAM: BP 104/70 (BP Location: Left Arm, Patient Position: Sitting, Cuff Size: Normal)   Pulse 88    Ht 5' 1 (1.549 m)   Wt 123 lb 8 oz (56 kg)   BMI 23.34 kg/m  Physical Exam   GENERAL APPEARANCE: Well nourished, in no apparent distress. HEENT: No cervical lymphadenopathy, unremarkable thyroid , sclerae anicteric, conjunctiva pink. RESPIRATORY: Respiratory effort normal, breath sounds equal bilaterally without rales, rhonchi, or wheezing. CARDIO: Regular rate and rhythm with no murmurs, rubs, or gallops, peripheral pulses intact. ABDOMEN: Soft, non-distended, active bowel sounds in all four quadrants, no tenderness to  palpation, no rebound, no mass appreciated. RECTAL: Declines. MUSCULOSKELETAL: Full range of motion, normal gait, without edema. SKIN: Dry, intact without rashes or lesions. No jaundice. NEURO: Alert, oriented, no focal deficits. PSYCH: Cooperative, normal mood and affect.      Alan JONELLE Coombs, PA-C 2:48 PM

## 2023-12-23 ENCOUNTER — Encounter: Payer: Self-pay | Admitting: Physician Assistant

## 2023-12-23 ENCOUNTER — Other Ambulatory Visit (HOSPITAL_COMMUNITY): Payer: Self-pay

## 2023-12-23 ENCOUNTER — Other Ambulatory Visit

## 2023-12-23 ENCOUNTER — Ambulatory Visit (INDEPENDENT_AMBULATORY_CARE_PROVIDER_SITE_OTHER): Admitting: Physician Assistant

## 2023-12-23 ENCOUNTER — Encounter: Payer: Self-pay | Admitting: Gastroenterology

## 2023-12-23 VITALS — BP 104/70 | HR 88 | Ht 61.0 in | Wt 123.5 lb

## 2023-12-23 DIAGNOSIS — R1012 Left upper quadrant pain: Secondary | ICD-10-CM

## 2023-12-23 DIAGNOSIS — R112 Nausea with vomiting, unspecified: Secondary | ICD-10-CM

## 2023-12-23 DIAGNOSIS — K921 Melena: Secondary | ICD-10-CM

## 2023-12-23 DIAGNOSIS — R110 Nausea: Secondary | ICD-10-CM

## 2023-12-23 DIAGNOSIS — Z8719 Personal history of other diseases of the digestive system: Secondary | ICD-10-CM

## 2023-12-23 DIAGNOSIS — F411 Generalized anxiety disorder: Secondary | ICD-10-CM | POA: Diagnosis not present

## 2023-12-23 DIAGNOSIS — K580 Irritable bowel syndrome with diarrhea: Secondary | ICD-10-CM

## 2023-12-23 DIAGNOSIS — R197 Diarrhea, unspecified: Secondary | ICD-10-CM

## 2023-12-23 DIAGNOSIS — Z8711 Personal history of peptic ulcer disease: Secondary | ICD-10-CM

## 2023-12-23 LAB — CBC WITH DIFFERENTIAL/PLATELET
Basophils Absolute: 0 K/uL (ref 0.0–0.1)
Basophils Relative: 0.9 % (ref 0.0–3.0)
Eosinophils Absolute: 0.1 K/uL (ref 0.0–0.7)
Eosinophils Relative: 1 % (ref 0.0–5.0)
HCT: 37 % (ref 36.0–46.0)
Hemoglobin: 12.5 g/dL (ref 12.0–15.0)
Lymphocytes Relative: 21.5 % (ref 12.0–46.0)
Lymphs Abs: 1.2 K/uL (ref 0.7–4.0)
MCHC: 33.7 g/dL (ref 30.0–36.0)
MCV: 92 fl (ref 78.0–100.0)
Monocytes Absolute: 0.5 K/uL (ref 0.1–1.0)
Monocytes Relative: 9.3 % (ref 3.0–12.0)
Neutro Abs: 3.7 K/uL (ref 1.4–7.7)
Neutrophils Relative %: 67.3 % (ref 43.0–77.0)
Platelets: 228 K/uL (ref 150.0–400.0)
RBC: 4.02 Mil/uL (ref 3.87–5.11)
RDW: 12.7 % (ref 11.5–15.5)
WBC: 5.5 K/uL (ref 4.0–10.5)

## 2023-12-23 LAB — SEDIMENTATION RATE: Sed Rate: 3 mm/h (ref 0–20)

## 2023-12-23 LAB — COMPREHENSIVE METABOLIC PANEL WITH GFR
ALT: 11 U/L (ref 0–35)
AST: 12 U/L (ref 0–37)
Albumin: 4.5 g/dL (ref 3.5–5.2)
Alkaline Phosphatase: 29 U/L — ABNORMAL LOW (ref 39–117)
BUN: 12 mg/dL (ref 6–23)
CO2: 26 meq/L (ref 19–32)
Calcium: 8.8 mg/dL (ref 8.4–10.5)
Chloride: 102 meq/L (ref 96–112)
Creatinine, Ser: 0.89 mg/dL (ref 0.40–1.20)
GFR: 83.36 mL/min (ref >60.00)
Glucose, Bld: 91 mg/dL (ref 70–99)
Potassium: 3.9 meq/L (ref 3.5–5.1)
Sodium: 135 meq/L (ref 135–145)
Total Bilirubin: 0.4 mg/dL (ref 0.2–1.2)
Total Protein: 7 g/dL (ref 6.0–8.3)

## 2023-12-23 LAB — C-REACTIVE PROTEIN: CRP: <1 mg/dL (ref 0.5–20.0)

## 2023-12-23 LAB — LIPASE: Lipase: 24 U/L (ref 11.0–59.0)

## 2023-12-23 MED ORDER — PANTOPRAZOLE SODIUM 40 MG PO TBEC
40.0000 mg | DELAYED_RELEASE_TABLET | Freq: Two times a day (BID) | ORAL | 0 refills | Status: AC
  Filled 2023-12-23: qty 60, 30d supply, fill #0

## 2023-12-23 MED ORDER — FAMOTIDINE 40 MG PO TABS
40.0000 mg | ORAL_TABLET | Freq: Every day | ORAL | 0 refills | Status: AC
  Filled 2023-12-23: qty 30, 30d supply, fill #0

## 2023-12-23 MED ORDER — HYOSCYAMINE SULFATE 0.125 MG SL SUBL
0.1250 mg | SUBLINGUAL_TABLET | Freq: Four times a day (QID) | SUBLINGUAL | 1 refills | Status: DC | PRN
Start: 2023-12-23 — End: 2024-01-04
  Filled 2023-12-23: qty 50, 13d supply, fill #0

## 2023-12-23 NOTE — Patient Instructions (Addendum)
 _______________________________________________________  If your blood pressure at your visit was 140/90 or greater, please contact your primary care physician to follow up on this.  _______________________________________________________  If you are age 36 or older, your body mass index should be between 23-30. Your Body mass index is 23.34 kg/m. If this is out of the aforementioned range listed, please consider follow up with your Primary Care Provider.  If you are age 54 or younger, your body mass index should be between 19-25. Your Body mass index is 23.34 kg/m. If this is out of the aformentioned range listed, please consider follow up with your Primary Care Provider.   ________________________________________________________  The [facility] providers would like to encourage you to use MYCHART to communicate with providers for non-urgent requests or questions.  Due to long hold times on the telephone, sending your provider a message by [facility] may be a faster and more efficient way to get a response.  Please allow 48 business hours for a response.  Please remember that this is for non-urgent requests.  _______________________________________________________  [facility] is using a team-based approach to care.  Your team is made up of your doctor and two to three APPS. Our APPS (Nurse Practitioners and Physician Assistants) work with your physician to ensure care continuity for you. They are fully qualified to address your health concerns and develop a treatment plan. They communicate directly with your gastroenterologist to care for you. Seeing the Advanced Practice Practitioners on your physician's team can help you by facilitating care more promptly, often allowing for earlier appointments, access to diagnostic testing, procedures, and other specialty referrals.    Your provider has requested that you go to the basement level for lab work before leaving today. Press B on the  elevator. The lab is located at the first door on the left as you exit the elevator.  You have been scheduled for an endoscopy. Please follow written instructions given to you at your visit today.  If you use inhalers (even only as needed), please bring them with you on the day of your procedure.  If you take any of the following medications, they will need to be adjusted prior to your procedure:   DO NOT TAKE 7 DAYS PRIOR TO TEST- Trulicity (dulaglutide) Ozempic, Wegovy (semaglutide) Mounjaro (tirzepatide) Bydureon Bcise (exanatide extended release)  DO NOT TAKE 1 DAY PRIOR TO YOUR TEST Rybelsus (semaglutide) Adlyxin (lixisenatide) Victoza (liraglutide) Byetta (exanatide) ___________________________________________________________________________  It was a pleasure to see you today!  Thank you for trusting me with your gastrointestinal care!

## 2023-12-23 NOTE — Progress Notes (Signed)
 Lauren Holmes

## 2023-12-24 ENCOUNTER — Ambulatory Visit: Payer: Self-pay | Admitting: Physician Assistant

## 2023-12-25 LAB — TISSUE TRANSGLUTAMINASE, IGA: (tTG) Ab, IgA: <1 U/mL

## 2023-12-25 LAB — IGA: Immunoglobulin A: 152 mg/dL (ref 47–310)

## 2023-12-26 ENCOUNTER — Encounter: Payer: Self-pay | Admitting: Gastroenterology

## 2023-12-28 ENCOUNTER — Telehealth: Payer: Self-pay | Admitting: Gastroenterology

## 2023-12-28 NOTE — Progress Notes (Signed)
 ____________________________________________________________  Attending physician addendum:  Thank you for sending this case to me. I have reviewed the entire note and agree with the plan.  Lab results normal so far.  US  pending.  EGD 12/28/23  Victory Brand, MD  ____________________________________________________________

## 2023-12-28 NOTE — Telephone Encounter (Signed)
 Must cancel EGD.  OK to have the abdominal ultrasound as planned to evaluate for gallstones.  OK to take zofran  as needed (which is on her med list), but recommend not taking the hyoscyamine  Alan prescribed.  Make Obstetrician appointment  - H. Danis

## 2023-12-28 NOTE — Telephone Encounter (Signed)
 Received call from patient, states she has PROPOFOL  EGD tomorrow but she just tested positive for pregnancy. Requesting nurse f/u to discuss other options, please review and advise!  Thank you!

## 2023-12-28 NOTE — Telephone Encounter (Signed)
 advise

## 2023-12-28 NOTE — Telephone Encounter (Signed)
 EGD cancelled. Patient notified. Abd u/s is not scheduled yet. Reminder message sent to the scheduling team. Patient is on track for [facility] appointment.

## 2023-12-28 NOTE — Telephone Encounter (Signed)
 Cancel.

## 2023-12-29 ENCOUNTER — Encounter: Admitting: Gastroenterology

## 2023-12-29 DIAGNOSIS — O2620 Pregnancy care for patient with recurrent pregnancy loss, unspecified trimester: Secondary | ICD-10-CM | POA: Diagnosis not present

## 2023-12-29 DIAGNOSIS — Z3200 Encounter for pregnancy test, result unknown: Secondary | ICD-10-CM | POA: Diagnosis not present

## 2024-01-04 ENCOUNTER — Encounter (HOSPITAL_COMMUNITY): Payer: Self-pay | Admitting: Obstetrics

## 2024-01-04 ENCOUNTER — Inpatient Hospital Stay (HOSPITAL_COMMUNITY)
Admission: AD | Admit: 2024-01-04 | Discharge: 2024-01-04 | Disposition: A | Discharge status: Home / Self Care | Attending: Obstetrics | Admitting: Obstetrics

## 2024-01-04 ENCOUNTER — Other Ambulatory Visit: Payer: Self-pay

## 2024-01-04 DIAGNOSIS — D508 Other iron deficiency anemias: Secondary | ICD-10-CM | POA: Diagnosis not present

## 2024-01-04 DIAGNOSIS — E889 Metabolic disorder, unspecified: Secondary | ICD-10-CM | POA: Diagnosis not present

## 2024-01-04 DIAGNOSIS — O219 Vomiting of pregnancy, unspecified: Secondary | ICD-10-CM

## 2024-01-04 DIAGNOSIS — N978 Female infertility of other origin: Secondary | ICD-10-CM | POA: Diagnosis not present

## 2024-01-04 DIAGNOSIS — R946 Abnormal results of thyroid function studies: Secondary | ICD-10-CM | POA: Diagnosis not present

## 2024-01-04 DIAGNOSIS — S069X0D Unspecified intracranial injury without loss of consciousness, subsequent encounter: Secondary | ICD-10-CM | POA: Diagnosis not present

## 2024-01-04 DIAGNOSIS — O99341 Other mental disorders complicating pregnancy, first trimester: Secondary | ICD-10-CM | POA: Diagnosis not present

## 2024-01-04 DIAGNOSIS — B488 Other specified mycoses: Secondary | ICD-10-CM | POA: Diagnosis not present

## 2024-01-04 DIAGNOSIS — Z3A01 Less than 8 weeks gestation of pregnancy: Secondary | ICD-10-CM | POA: Diagnosis not present

## 2024-01-04 DIAGNOSIS — E538 Deficiency of other specified B group vitamins: Secondary | ICD-10-CM | POA: Diagnosis not present

## 2024-01-04 DIAGNOSIS — F39 Unspecified mood [affective] disorder: Secondary | ICD-10-CM | POA: Diagnosis not present

## 2024-01-04 DIAGNOSIS — F419 Anxiety disorder, unspecified: Secondary | ICD-10-CM

## 2024-01-04 DIAGNOSIS — E279 Disorder of adrenal gland, unspecified: Secondary | ICD-10-CM | POA: Diagnosis not present

## 2024-01-04 DIAGNOSIS — F410 Panic disorder [episodic paroxysmal anxiety] without agoraphobia: Secondary | ICD-10-CM | POA: Diagnosis not present

## 2024-01-04 DIAGNOSIS — E618 Deficiency of other specified nutrient elements: Secondary | ICD-10-CM | POA: Diagnosis not present

## 2024-01-04 LAB — COMPREHENSIVE METABOLIC PANEL WITH GFR
ALT: 15 U/L (ref 0–44)
AST: 16 U/L (ref 15–41)
Albumin: 4.1 g/dL (ref 3.5–5.0)
Alkaline Phosphatase: 29 U/L — ABNORMAL LOW (ref 38–126)
Anion gap: 10 (ref 5–15)
BUN: 5 mg/dL — ABNORMAL LOW (ref 6–20)
CO2: 21 mmol/L — ABNORMAL LOW (ref 22–32)
Calcium: 8.7 mg/dL — ABNORMAL LOW (ref 8.9–10.3)
Chloride: 103 mmol/L (ref 98–111)
Creatinine, Ser: 0.68 mg/dL (ref 0.44–1.00)
GFR, Estimated: >60 mL/min (ref >60)
Glucose, Bld: 104 mg/dL — ABNORMAL HIGH (ref 70–99)
Potassium: 3.6 mmol/L (ref 3.5–5.1)
Sodium: 134 mmol/L — ABNORMAL LOW (ref 135–145)
Total Bilirubin: 0.8 mg/dL (ref 0.0–1.2)
Total Protein: 6.8 g/dL (ref 6.5–8.1)

## 2024-01-04 LAB — URINALYSIS, ROUTINE W REFLEX MICROSCOPIC
Bilirubin Urine: NEGATIVE
Glucose, UA: NEGATIVE mg/dL
Hgb urine dipstick: NEGATIVE
Ketones, ur: NEGATIVE mg/dL
Leukocytes,Ua: NEGATIVE
Nitrite: NEGATIVE
Protein, ur: NEGATIVE mg/dL
Specific Gravity, Urine: 1.005 (ref 1.005–1.030)
pH: 7 (ref 5.0–8.0)

## 2024-01-04 LAB — POCT PREGNANCY, URINE: Preg Test, Ur: POSITIVE — AB

## 2024-01-04 MED ORDER — METOCLOPRAMIDE HCL 10 MG PO TABS: 10.0000 mg | ORAL_TABLET | Freq: Four times a day (QID) | ORAL | 0 refills | Status: AC

## 2024-01-04 MED ORDER — PROMETHAZINE HCL 25 MG PO TABS
25.0000 mg | ORAL_TABLET | Freq: Four times a day (QID) | ORAL | 2 refills | Status: AC | PRN
  Filled 2024-01-10: qty 30, 8d supply, fill #0

## 2024-01-04 MED ORDER — ONDANSETRON 4 MG PO TBDP
8.0000 mg | ORAL_TABLET | Freq: Once | ORAL | Status: DC
  Filled 2024-01-04: qty 2

## 2024-01-04 NOTE — MAU Note (Signed)
 Lauren Holmes is a 38 y.o. at Unknown here in MAU reporting: she was instructed to be seen for N/V and receive banana bag, isn't currently vomiting but is dry heaving and sometime vomits bile.  States hasn't vomited any in the past 24 hours.  States has a Hx of panic attacks when doesn't sleep and didn't sleep last night, currently having panic attack  LMP: 11/18/2023 Onset of complaint: 1 week ago Pain score: 1 Vitals:   01/04/24 1215  BP: 117/83  Pulse: (!) 119  Resp: 20  Temp: 98 F (36.7 C)  SpO2: 100%     FHT: NA  Lab orders placed from triage: UPT

## 2024-01-04 NOTE — MAU Provider Note (Signed)
 None     S [name] is a 38 y.o. G1P0 female at [redacted]w[redacted]d who presents to MAU today with complaint of nausea/vomiting.  She reports that she has tried Zofran  at home without relief.  She also has a prescription for Phenergan  but has not taken it as she is very concerned with harming her baby.  Her anxiety has been high and she has had frequent panic attacks.  She has used BuSpar  in the past as well as Klonopin for rescue but is not taking either because of concern for her baby.  Receives care at [facility]. Prenatal records reviewed.  Pertinent items noted in HPI and remainder of comprehensive ROS otherwise negative.   O BP 117/83 (BP Location: Right Arm)   Pulse (!) 119   Temp 98 F (36.7 C) (Oral)   Resp 20   Ht 5' 1 (1.549 m)   Wt 54.5 kg   LMP 11/21/2023   SpO2 100%   BMI 22.71 kg/m  Physical Exam Vitals reviewed.  Constitutional:      General: She is not in acute distress.    Appearance: She is well-developed. She is not diaphoretic.  Eyes:     General: No scleral icterus. Pulmonary:     Effort: Pulmonary effort is normal. No respiratory distress.  Skin:    General: Skin is warm and dry.  Neurological:     Mental Status: She is alert and oriented to person, place, and time.     Coordination: Coordination normal.  Psychiatric:        Mood and Affect: Mood is anxious. Affect is tearful.    Results for orders placed or performed during the hospital encounter of 01/04/24 (from the past 24 hours)  Pregnancy, urine POC     Status: Abnormal   Collection Time: 01/04/24 12:25 PM  Result Value Ref Range   Preg Test, Ur POSITIVE (A) NEGATIVE  Comprehensive metabolic panel     Status: Abnormal   Collection Time: 01/04/24 12:51 PM  Result Value Ref Range   Sodium 134 (L) 135 - 145 mmol/L   Potassium 3.6 3.5 - 5.1 mmol/L   Chloride 103 98 - 111 mmol/L   CO2 21 (L) 22 - 32 mmol/L   Glucose, Bld 104 (H) 70 - 99 mg/dL   BUN 5 (L) 6 - 20 mg/dL   Creatinine, Ser 9.31  0.44 - 1.00 mg/dL   Calcium 8.7 (L) 8.9 - 10.3 mg/dL   Total Protein 6.8 6.5 - 8.1 g/dL   Albumin 4.1 3.5 - 5.0 g/dL   AST 16 15 - 41 U/L   ALT 15 0 - 44 U/L   Alkaline Phosphatase 29 (L) 38 - 126 U/L   Total Bilirubin 0.8 0.0 - 1.2 mg/dL   GFR, Estimated >39 >39 mL/min   Anion gap 10 5 - 15  Urinalysis, Routine w reflex microscopic -Urine, Clean Catch     Status: Abnormal   Collection Time: 01/04/24 12:59 PM  Result Value Ref Range   Color, Urine STRAW (A) YELLOW   APPearance CLEAR CLEAR   Specific Gravity, Urine 1.005 1.005 - 1.030   pH 7.0 5.0 - 8.0   Glucose, UA NEGATIVE NEGATIVE mg/dL   Hgb urine dipstick NEGATIVE NEGATIVE   Bilirubin Urine NEGATIVE NEGATIVE   Ketones, ur NEGATIVE NEGATIVE mg/dL   Protein, ur NEGATIVE NEGATIVE mg/dL   Nitrite NEGATIVE NEGATIVE   Leukocytes,Ua NEGATIVE NEGATIVE    MDM: Moderate MAU Course: -Tachycardia, otherwise vitals within normal  limits. -No signs of dehydration on UA or CMP.  A 1. Nausea and vomiting during pregnancy (Primary)  2. Panic attack  3. Anxiety  4. [redacted] weeks gestation of pregnancy   Medical screening exam complete  P Discharge from MAU in stable condition with return precautions Follow up at [facility] as scheduled for ongoing prenatal care Provided reassurance that Zofran , Phenergan , or Reglan  are safe for nausea/vomiting in pregnancy.  Prescriptions sent for Phenergan  and Reglan . Encouraged to discuss mental health with her OB/GYN.  She has used BuSpar  in the past. Discussed that it is safe to use Benadryl  during pregnancy for panic attacks or at bedtime to help her sleep.  Future Appointments  Date Time Provider Department Center  01/11/2024  9:30 AM [facility]  5 [facility]   Allergies as of 01/04/2024       Reactions   Oxycodone  Nausea And Vomiting, Itching, Nausea Only   Hydroxyzine  Itching, Rash        Medication List     STOP taking these medications    Oscimin  0.125 MG Subl Generic drug:  Hyoscyamine  Sulfate SL       TAKE these medications    ALPRAZolam  0.5 MG tablet Commonly known as: Xanax  Take 1 tablet (0.5 mg total) by mouth at bedtime for sleep.   cyclobenzaprine  5 MG tablet Commonly known as: FLEXERIL  Take 1 tablet (5 mg total) by mouth 3 (three) times daily as needed.   DULoxetine  20 MG capsule Commonly known as: CYMBALTA  Take 2 capsules (40 mg total) by mouth daily.   famotidine  40 MG tablet Commonly known as: Pepcid  Take 1 tablet (40 mg total) by mouth at bedtime.   metoCLOPramide  10 MG tablet Commonly known as: REGLAN  Take 1 tablet (10 mg total) by mouth every 6 (six) hours.   metoprolol  tartrate 25 MG tablet Commonly known as: LOPRESSOR  Take 1 tablet (25 mg total) by mouth as needed for heart palpitations.   pantoprazole  40 MG tablet Commonly known as: PROTONIX  Take 1 tablet (40 mg total) by mouth 2 (two) times daily before a meal.   promethazine  25 MG tablet Commonly known as: PHENERGAN  Take 1 tablet (25 mg total) by mouth every 6 (six) hours as needed for nausea or vomiting.   Quintabs Tabs Take 1 tablet by mouth daily.   sucralfate 1 g tablet Commonly known as: CARAFATE Take 1 g by mouth 4 (four) times daily.   SUMAtriptan 25 MG tablet Commonly known as: IMITREX Take 25 mg by mouth every 2 (two) hours as needed for migraine.   traZODone  50 MG tablet Commonly known as: DESYREL  Take 1 tablet (50 mg total) by mouth at bedtime.        Joesph DELENA Sear, PA

## 2024-01-04 NOTE — MAU Note (Signed)
 Zofran  ordered for pt. She did not want it state she ws told she would get IV meds. Told her she was not dehydrated and had Provider talk with her

## 2024-01-05 ENCOUNTER — Other Ambulatory Visit (HOSPITAL_COMMUNITY): Payer: Self-pay

## 2024-01-05 DIAGNOSIS — Z3201 Encounter for pregnancy test, result positive: Secondary | ICD-10-CM | POA: Diagnosis not present

## 2024-01-05 MED ORDER — METOCLOPRAMIDE HCL 5 MG PO TABS
5.0000 mg | ORAL_TABLET | Freq: Four times a day (QID) | ORAL | 0 refills | Status: DC
  Filled 2024-01-05: qty 30, 8d supply, fill #0

## 2024-01-05 MED ORDER — DULOXETINE HCL 20 MG PO CPEP
40.0000 mg | ORAL_CAPSULE | Freq: Every day | ORAL | 1 refills | Status: DC
  Filled 2024-01-05 – 2024-01-10 (×2): qty 60, 30d supply, fill #0
  Filled 2024-02-28: qty 60, 30d supply, fill #1
  Filled 2024-02-29: qty 60, 30d supply, fill #0

## 2024-01-05 MED ORDER — ZOLPIDEM TARTRATE 5 MG PO TABS
5.0000 mg | ORAL_TABLET | Freq: Every evening | ORAL | 0 refills | Status: DC
  Filled 2024-01-05: qty 10, 10d supply, fill #0

## 2024-01-05 MED ORDER — HYDROXYZINE PAMOATE 50 MG PO CAPS
50.0000 mg | ORAL_CAPSULE | Freq: Every evening | ORAL | 0 refills | Status: DC
  Filled 2024-01-05: qty 30, 30d supply, fill #0

## 2024-01-10 ENCOUNTER — Other Ambulatory Visit (HOSPITAL_COMMUNITY): Payer: Self-pay

## 2024-01-10 ENCOUNTER — Other Ambulatory Visit: Payer: Self-pay

## 2024-01-10 MED ORDER — METOCLOPRAMIDE HCL 5 MG PO TABS
5.0000 mg | ORAL_TABLET | Freq: Four times a day (QID) | ORAL | 0 refills | Status: AC
  Filled 2024-01-11: qty 30, 8d supply, fill #0

## 2024-01-10 MED ORDER — ONDANSETRON 4 MG PO TBDP
4.0000 mg | ORAL_TABLET | Freq: Four times a day (QID) | ORAL | 3 refills | Status: AC | PRN
  Filled 2024-01-10: qty 36, 9d supply, fill #0

## 2024-01-11 ENCOUNTER — Ambulatory Visit (HOSPITAL_COMMUNITY): Admission: RE | Admit: 2024-01-11 | Source: Ambulatory Visit

## 2024-01-11 ENCOUNTER — Other Ambulatory Visit (HOSPITAL_COMMUNITY): Payer: Self-pay

## 2024-01-11 DIAGNOSIS — F331 Major depressive disorder, recurrent, moderate: Secondary | ICD-10-CM | POA: Diagnosis not present

## 2024-01-12 ENCOUNTER — Other Ambulatory Visit (HOSPITAL_COMMUNITY): Payer: Self-pay

## 2024-01-12 ENCOUNTER — Encounter (HOSPITAL_COMMUNITY): Payer: Self-pay

## 2024-01-12 DIAGNOSIS — R112 Nausea with vomiting, unspecified: Secondary | ICD-10-CM | POA: Diagnosis not present

## 2024-01-12 MED ORDER — PROMETHAZINE HCL 25 MG RE SUPP
25.0000 mg | Freq: Every day | RECTAL | 1 refills | Status: AC
  Filled 2024-01-12: qty 30, 30d supply, fill #0

## 2024-01-14 ENCOUNTER — Other Ambulatory Visit (HOSPITAL_COMMUNITY): Payer: Self-pay

## 2024-01-14 MED ORDER — SCOPOLAMINE 1 MG/3DAYS TD PT72
1.0000 | MEDICATED_PATCH | TRANSDERMAL | 1 refills | Status: AC | PRN
  Filled 2024-01-14: qty 10, 30d supply, fill #0

## 2024-01-25 DIAGNOSIS — F331 Major depressive disorder, recurrent, moderate: Secondary | ICD-10-CM | POA: Diagnosis not present

## 2024-01-26 DIAGNOSIS — Z3689 Encounter for other specified antenatal screening: Secondary | ICD-10-CM | POA: Diagnosis not present

## 2024-01-26 DIAGNOSIS — Z118 Encounter for screening for other infectious and parasitic diseases: Secondary | ICD-10-CM | POA: Diagnosis not present

## 2024-01-26 DIAGNOSIS — Z3A10 10 weeks gestation of pregnancy: Secondary | ICD-10-CM | POA: Diagnosis not present

## 2024-01-26 DIAGNOSIS — O09521 Supervision of elderly multigravida, first trimester: Secondary | ICD-10-CM | POA: Diagnosis not present

## 2024-01-26 DIAGNOSIS — O09511 Supervision of elderly primigravida, first trimester: Secondary | ICD-10-CM | POA: Diagnosis not present

## 2024-02-22 DIAGNOSIS — E889 Metabolic disorder, unspecified: Secondary | ICD-10-CM | POA: Diagnosis not present

## 2024-02-22 DIAGNOSIS — N978 Female infertility of other origin: Secondary | ICD-10-CM | POA: Diagnosis not present

## 2024-02-22 DIAGNOSIS — B488 Other specified mycoses: Secondary | ICD-10-CM | POA: Diagnosis not present

## 2024-02-22 DIAGNOSIS — S069X0D Unspecified intracranial injury without loss of consciousness, subsequent encounter: Secondary | ICD-10-CM | POA: Diagnosis not present

## 2024-02-22 DIAGNOSIS — R946 Abnormal results of thyroid function studies: Secondary | ICD-10-CM | POA: Diagnosis not present

## 2024-02-22 DIAGNOSIS — D508 Other iron deficiency anemias: Secondary | ICD-10-CM | POA: Diagnosis not present

## 2024-02-22 DIAGNOSIS — E618 Deficiency of other specified nutrient elements: Secondary | ICD-10-CM | POA: Diagnosis not present

## 2024-02-22 DIAGNOSIS — E279 Disorder of adrenal gland, unspecified: Secondary | ICD-10-CM | POA: Diagnosis not present

## 2024-02-22 DIAGNOSIS — F39 Unspecified mood [affective] disorder: Secondary | ICD-10-CM | POA: Diagnosis not present

## 2024-02-22 DIAGNOSIS — E538 Deficiency of other specified B group vitamins: Secondary | ICD-10-CM | POA: Diagnosis not present

## 2024-02-29 ENCOUNTER — Other Ambulatory Visit (HOSPITAL_COMMUNITY): Payer: Self-pay

## 2024-02-29 ENCOUNTER — Other Ambulatory Visit: Payer: Self-pay

## 2024-02-29 DIAGNOSIS — F331 Major depressive disorder, recurrent, moderate: Secondary | ICD-10-CM | POA: Diagnosis not present

## 2024-03-07 DIAGNOSIS — O09292 Supervision of pregnancy with other poor reproductive or obstetric history, second trimester: Secondary | ICD-10-CM | POA: Diagnosis not present

## 2024-03-07 DIAGNOSIS — Z3A16 16 weeks gestation of pregnancy: Secondary | ICD-10-CM | POA: Diagnosis not present

## 2024-03-08 DIAGNOSIS — F331 Major depressive disorder, recurrent, moderate: Secondary | ICD-10-CM | POA: Diagnosis not present

## 2024-03-27 DIAGNOSIS — F331 Major depressive disorder, recurrent, moderate: Secondary | ICD-10-CM | POA: Diagnosis not present

## 2024-03-28 DIAGNOSIS — Z3A19 19 weeks gestation of pregnancy: Secondary | ICD-10-CM | POA: Diagnosis not present

## 2024-03-28 DIAGNOSIS — O09522 Supervision of elderly multigravida, second trimester: Secondary | ICD-10-CM | POA: Diagnosis not present

## 2024-04-04 DIAGNOSIS — F331 Major depressive disorder, recurrent, moderate: Secondary | ICD-10-CM | POA: Diagnosis not present

## 2024-04-11 DIAGNOSIS — F331 Major depressive disorder, recurrent, moderate: Secondary | ICD-10-CM | POA: Diagnosis not present

## 2024-04-13 ENCOUNTER — Other Ambulatory Visit (HOSPITAL_COMMUNITY): Payer: Self-pay

## 2024-04-17 DIAGNOSIS — F331 Major depressive disorder, recurrent, moderate: Secondary | ICD-10-CM | POA: Diagnosis not present

## 2024-04-19 ENCOUNTER — Other Ambulatory Visit (HOSPITAL_COMMUNITY): Payer: Self-pay

## 2024-04-24 DIAGNOSIS — Z8759 Personal history of other complications of pregnancy, childbirth and the puerperium: Secondary | ICD-10-CM | POA: Diagnosis not present

## 2024-04-24 DIAGNOSIS — Z362 Encounter for other antenatal screening follow-up: Secondary | ICD-10-CM | POA: Diagnosis not present

## 2024-04-25 ENCOUNTER — Other Ambulatory Visit (HOSPITAL_COMMUNITY): Payer: Self-pay

## 2024-04-25 MED ORDER — ACCU-CHEK SOFTCLIX LANCET DEV KIT
PACK | 0 refills | Status: AC
  Filled 2024-05-17: qty 1, fill #0

## 2024-04-25 MED ORDER — FREESTYLE LITE W/DEVICE KIT
1.0000 | PACK | Freq: Four times a day (QID) | 0 refills | Status: AC
  Filled 2024-04-25 – 2024-05-17 (×3): qty 1, 30d supply, fill #0

## 2024-04-25 MED ORDER — ACCU-CHEK GUIDE TEST VI STRP
1.0000 | ORAL_STRIP | Freq: Four times a day (QID) | 2 refills | Status: AC
  Filled 2024-04-25: qty 100, 25d supply, fill #0
  Filled 2024-05-01: qty 200, 50d supply, fill #0
  Filled 2024-05-17: qty 150, 38d supply, fill #0
  Filled 2024-05-23: qty 200, 50d supply, fill #0
  Filled 2024-05-25: qty 300, 75d supply, fill #0
  Filled 2024-05-26: qty 50, 13d supply, fill #0

## 2024-04-25 MED ORDER — FREESTYLE LANCETS MISC
1.0000 | Freq: Four times a day (QID) | 2 refills | Status: AC
  Filled 2024-04-25 – 2024-05-17 (×3): qty 100, 25d supply, fill #0

## 2024-05-01 ENCOUNTER — Other Ambulatory Visit (HOSPITAL_COMMUNITY): Payer: Self-pay

## 2024-05-02 ENCOUNTER — Other Ambulatory Visit: Payer: Self-pay

## 2024-05-08 ENCOUNTER — Other Ambulatory Visit: Payer: Self-pay

## 2024-05-17 ENCOUNTER — Other Ambulatory Visit (HOSPITAL_COMMUNITY): Payer: Self-pay

## 2024-05-17 ENCOUNTER — Other Ambulatory Visit: Payer: Self-pay

## 2024-05-18 ENCOUNTER — Other Ambulatory Visit (HOSPITAL_COMMUNITY): Payer: Self-pay

## 2024-05-18 ENCOUNTER — Other Ambulatory Visit: Payer: Self-pay

## 2024-05-19 ENCOUNTER — Other Ambulatory Visit (HOSPITAL_COMMUNITY): Payer: Self-pay

## 2024-05-19 ENCOUNTER — Other Ambulatory Visit: Payer: Self-pay

## 2024-05-23 ENCOUNTER — Other Ambulatory Visit (HOSPITAL_COMMUNITY): Payer: Self-pay

## 2024-05-23 ENCOUNTER — Other Ambulatory Visit: Payer: Self-pay

## 2024-05-24 ENCOUNTER — Other Ambulatory Visit (HOSPITAL_COMMUNITY): Payer: Self-pay

## 2024-05-25 ENCOUNTER — Other Ambulatory Visit: Payer: Self-pay

## 2024-05-26 ENCOUNTER — Other Ambulatory Visit (HOSPITAL_COMMUNITY): Payer: Self-pay

## 2024-06-01 ENCOUNTER — Other Ambulatory Visit (HOSPITAL_COMMUNITY): Payer: Self-pay

## 2024-06-23 ENCOUNTER — Ambulatory Visit: Admitting: Physical Therapy
